# Patient Record
Sex: Male | Born: 1980 | Race: White | Hispanic: No | Marital: Married | Smoking: Never smoker
Health system: Southern US, Community
[De-identification: ages and names within clinical notes are randomized; demographics above are authoritative.]

## PROBLEM LIST (undated history)

## (undated) DIAGNOSIS — I10 Essential (primary) hypertension: Secondary | ICD-10-CM

---

## 2021-03-05 ENCOUNTER — Emergency Department (HOSPITAL_COMMUNITY): Payer: Managed Care, Other (non HMO)

## 2021-03-05 ENCOUNTER — Encounter (HOSPITAL_COMMUNITY): Payer: Self-pay

## 2021-03-05 ENCOUNTER — Inpatient Hospital Stay (HOSPITAL_COMMUNITY): Payer: Managed Care, Other (non HMO)

## 2021-03-05 ENCOUNTER — Other Ambulatory Visit: Payer: Self-pay

## 2021-03-05 ENCOUNTER — Inpatient Hospital Stay (HOSPITAL_COMMUNITY)
Admission: EM | Admit: 2021-03-05 | Discharge: 2021-03-11 | DRG: 065 | Disposition: A | Discharge status: Home / Self Care | Payer: Managed Care, Other (non HMO) | Attending: Neurological Surgery | Admitting: Neurological Surgery

## 2021-03-05 DIAGNOSIS — I618 Other nontraumatic intracerebral hemorrhage: Principal | ICD-10-CM | POA: Diagnosis present

## 2021-03-05 DIAGNOSIS — R480 Dyslexia and alexia: Secondary | ICD-10-CM | POA: Diagnosis present

## 2021-03-05 DIAGNOSIS — G919 Hydrocephalus, unspecified: Secondary | ICD-10-CM | POA: Diagnosis present

## 2021-03-05 DIAGNOSIS — I10 Essential (primary) hypertension: Secondary | ICD-10-CM | POA: Diagnosis present

## 2021-03-05 DIAGNOSIS — G9389 Other specified disorders of brain: Secondary | ICD-10-CM | POA: Diagnosis not present

## 2021-03-05 DIAGNOSIS — Z20822 Contact with and (suspected) exposure to covid-19: Secondary | ICD-10-CM | POA: Diagnosis present

## 2021-03-05 DIAGNOSIS — J302 Other seasonal allergic rhinitis: Secondary | ICD-10-CM | POA: Diagnosis present

## 2021-03-05 DIAGNOSIS — M436 Torticollis: Secondary | ICD-10-CM | POA: Diagnosis present

## 2021-03-05 DIAGNOSIS — E785 Hyperlipidemia, unspecified: Secondary | ICD-10-CM | POA: Diagnosis present

## 2021-03-05 DIAGNOSIS — I6389 Other cerebral infarction: Secondary | ICD-10-CM | POA: Diagnosis not present

## 2021-03-05 DIAGNOSIS — G43109 Migraine with aura, not intractable, without status migrainosus: Secondary | ICD-10-CM | POA: Diagnosis present

## 2021-03-05 DIAGNOSIS — I609 Nontraumatic subarachnoid hemorrhage, unspecified: Secondary | ICD-10-CM

## 2021-03-05 DIAGNOSIS — Z6841 Body Mass Index (BMI) 40.0 and over, adult: Secondary | ICD-10-CM | POA: Diagnosis not present

## 2021-03-05 DIAGNOSIS — I608 Other nontraumatic subarachnoid hemorrhage: Secondary | ICD-10-CM

## 2021-03-05 DIAGNOSIS — I615 Nontraumatic intracerebral hemorrhage, intraventricular: Secondary | ICD-10-CM | POA: Diagnosis present

## 2021-03-05 DIAGNOSIS — H538 Other visual disturbances: Secondary | ICD-10-CM | POA: Diagnosis present

## 2021-03-05 HISTORY — DX: Essential (primary) hypertension: I10

## 2021-03-05 LAB — CBC WITH DIFFERENTIAL/PLATELET
Abs Immature Granulocytes: 0.08 10*3/uL — ABNORMAL HIGH (ref 0.00–0.07)
Basophils Absolute: 0.1 10*3/uL (ref 0.0–0.1)
Basophils Relative: 1 %
Eosinophils Absolute: 0.1 10*3/uL (ref 0.0–0.5)
Eosinophils Relative: 1 %
HCT: 41 % (ref 39.0–52.0)
Hemoglobin: 13.6 g/dL (ref 13.0–17.0)
Immature Granulocytes: 1 %
Lymphocytes Relative: 21 %
Lymphs Abs: 1.9 10*3/uL (ref 0.7–4.0)
MCH: 30.7 pg (ref 26.0–34.0)
MCHC: 33.2 g/dL (ref 30.0–36.0)
MCV: 92.6 fL (ref 80.0–100.0)
Monocytes Absolute: 0.5 10*3/uL (ref 0.1–1.0)
Monocytes Relative: 6 %
Neutro Abs: 6.6 10*3/uL (ref 1.7–7.7)
Neutrophils Relative %: 70 %
Platelets: 299 10*3/uL (ref 150–400)
RBC: 4.43 MIL/uL (ref 4.22–5.81)
RDW: 12.2 % (ref 11.5–15.5)
WBC: 9.2 10*3/uL (ref 4.0–10.5)
nRBC: 0 % (ref 0.0–0.2)

## 2021-03-05 LAB — ECHOCARDIOGRAM COMPLETE
AR max vel: 3.21 cm2
AV Area VTI: 3.3 cm2
AV Area mean vel: 3.2 cm2
AV Mean grad: 6 mmHg
AV Peak grad: 10.5 mmHg
Ao pk vel: 1.62 m/s
Area-P 1/2: 4.39 cm2
Height: 72 in
S' Lateral: 3.8 cm
Weight: 5153.47 oz

## 2021-03-05 LAB — I-STAT CHEM 8, ED
BUN: 11 mg/dL (ref 6–20)
Calcium, Ion: 1.07 mmol/L — ABNORMAL LOW (ref 1.15–1.40)
Chloride: 103 mmol/L (ref 98–111)
Creatinine, Ser: 1 mg/dL (ref 0.61–1.24)
Glucose, Bld: 120 mg/dL — ABNORMAL HIGH (ref 70–99)
HCT: 48 % (ref 39.0–52.0)
Hemoglobin: 16.3 g/dL (ref 13.0–17.0)
Potassium: 3.5 mmol/L (ref 3.5–5.1)
Sodium: 138 mmol/L (ref 135–145)
TCO2: 24 mmol/L (ref 22–32)

## 2021-03-05 LAB — HEMOGLOBIN A1C
Hgb A1c MFr Bld: 5.7 % — ABNORMAL HIGH (ref 4.8–5.6)
Mean Plasma Glucose: 116.89 mg/dL

## 2021-03-05 LAB — URINALYSIS, ROUTINE W REFLEX MICROSCOPIC
Bilirubin Urine: NEGATIVE
Glucose, UA: NEGATIVE mg/dL
Hgb urine dipstick: NEGATIVE
Ketones, ur: NEGATIVE mg/dL
Leukocytes,Ua: NEGATIVE
Nitrite: NEGATIVE
Protein, ur: NEGATIVE mg/dL
Specific Gravity, Urine: 1.006 (ref 1.005–1.030)
pH: 7 (ref 5.0–8.0)

## 2021-03-05 LAB — GLUCOSE, CAPILLARY
Glucose-Capillary: 131 mg/dL — ABNORMAL HIGH (ref 70–99)
Glucose-Capillary: 113 mg/dL — ABNORMAL HIGH (ref 70–99)
Glucose-Capillary: 112 mg/dL — ABNORMAL HIGH (ref 70–99)
Glucose-Capillary: 114 mg/dL — ABNORMAL HIGH (ref 70–99)
Glucose-Capillary: 104 mg/dL — ABNORMAL HIGH (ref 70–99)

## 2021-03-05 LAB — APTT: aPTT: 26 seconds (ref 24–36)

## 2021-03-05 LAB — COMPREHENSIVE METABOLIC PANEL
ALT: 36 U/L (ref 0–44)
AST: 28 U/L (ref 15–41)
Albumin: 4 g/dL (ref 3.5–5.0)
Alkaline Phosphatase: 64 U/L (ref 38–126)
Anion gap: 10 (ref 5–15)
BUN: 16 mg/dL (ref 6–20)
CO2: 25 mmol/L (ref 22–32)
Calcium: 9 mg/dL (ref 8.9–10.3)
Chloride: 100 mmol/L (ref 98–111)
Creatinine, Ser: 1.14 mg/dL (ref 0.61–1.24)
GFR, Estimated: >60 mL/min (ref >60)
Glucose, Bld: 162 mg/dL — ABNORMAL HIGH (ref 70–99)
Potassium: 3.9 mmol/L (ref 3.5–5.1)
Sodium: 135 mmol/L (ref 135–145)
Total Bilirubin: 0.8 mg/dL (ref 0.3–1.2)
Total Protein: 7.1 g/dL (ref 6.5–8.1)

## 2021-03-05 LAB — HIV ANTIBODY (ROUTINE TESTING W REFLEX): HIV Screen 4th Generation wRfx: NONREACTIVE

## 2021-03-05 LAB — RESP PANEL BY RT-PCR (FLU A&B, COVID) ARPGX2
Influenza A by PCR: NEGATIVE
Influenza B by PCR: NEGATIVE
SARS Coronavirus 2 by RT PCR: NEGATIVE

## 2021-03-05 LAB — PROTIME-INR
INR: 1 (ref 0.8–1.2)
Prothrombin Time: 13 seconds (ref 11.4–15.2)

## 2021-03-05 LAB — LIPID PANEL
Cholesterol: 146 mg/dL (ref 0–200)
HDL: 47 mg/dL (ref >40)
LDL Cholesterol: 91 mg/dL (ref 0–99)
Total CHOL/HDL Ratio: 3.1 RATIO
Triglycerides: 42 mg/dL (ref <150)
VLDL: 8 mg/dL (ref 0–40)

## 2021-03-05 LAB — RAPID URINE DRUG SCREEN, HOSP PERFORMED
Amphetamines: NOT DETECTED
Barbiturates: NOT DETECTED
Benzodiazepines: NOT DETECTED
Cocaine: NOT DETECTED
Opiates: NOT DETECTED
Tetrahydrocannabinol: NOT DETECTED

## 2021-03-05 LAB — MRSA PCR SCREENING: MRSA by PCR: NEGATIVE

## 2021-03-05 LAB — TSH: TSH: 2.009 u[IU]/mL (ref 0.350–4.500)

## 2021-03-05 LAB — ETHANOL: Alcohol, Ethyl (B): <10 mg/dL (ref <10)

## 2021-03-05 MED ORDER — ACETAMINOPHEN 650 MG RE SUPP: 650.0000 mg | RECTAL | Status: DC | PRN

## 2021-03-05 MED ORDER — STROKE: EARLY STAGES OF RECOVERY BOOK: Freq: Once | Status: DC

## 2021-03-05 MED ORDER — LABETALOL HCL 5 MG/ML IV SOLN
5.0000 mg | INTRAVENOUS | Status: DC | PRN
  Administered 2021-03-05 – 2021-03-06 (×4): 5 mg via INTRAVENOUS
  Filled 2021-03-05 (×4): qty 4

## 2021-03-05 MED ORDER — OXYCODONE HCL 5 MG PO TABS
10.0000 mg | ORAL_TABLET | ORAL | Status: DC | PRN
  Administered 2021-03-05 – 2021-03-08 (×3): 10 mg via ORAL
  Filled 2021-03-05 (×2): qty 2

## 2021-03-05 MED ORDER — ROSUVASTATIN CALCIUM 5 MG PO TABS
10.0000 mg | ORAL_TABLET | Freq: Every day | ORAL | Status: DC
  Administered 2021-03-05 – 2021-03-10 (×6): 10 mg via ORAL
  Filled 2021-03-05 (×6): qty 2

## 2021-03-05 MED ORDER — STROKE: EARLY STAGES OF RECOVERY BOOK
Freq: Once | Status: AC
  Filled 2021-03-05: qty 1

## 2021-03-05 MED ORDER — GADOBUTROL 1 MMOL/ML IV SOLN
10.0000 mL | Freq: Once | INTRAVENOUS | Status: AC | PRN
  Administered 2021-03-05: 10 mL via INTRAVENOUS

## 2021-03-05 MED ORDER — ACETAMINOPHEN 160 MG/5ML PO SOLN: 650.0000 mg | ORAL | Status: DC | PRN

## 2021-03-05 MED ORDER — PANTOPRAZOLE SODIUM 40 MG IV SOLR
40.0000 mg | Freq: Every day | INTRAVENOUS | Status: DC
  Administered 2021-03-05 – 2021-03-10 (×6): 40 mg via INTRAVENOUS
  Filled 2021-03-05 (×6): qty 40

## 2021-03-05 MED ORDER — FLUTICASONE PROPIONATE 50 MCG/ACT NA SUSP
1.0000 | Freq: Every day | NASAL | Status: DC
  Administered 2021-03-06 – 2021-03-11 (×4): 1 via NASAL
  Filled 2021-03-05: qty 16

## 2021-03-05 MED ORDER — NIMODIPINE 30 MG PO CAPS
60.0000 mg | ORAL_CAPSULE | ORAL | Status: DC
  Administered 2021-03-05 – 2021-03-08 (×19): 60 mg via ORAL
  Filled 2021-03-05 (×19): qty 2

## 2021-03-05 MED ORDER — IRBESARTAN 150 MG PO TABS
150.0000 mg | ORAL_TABLET | Freq: Every day | ORAL | Status: DC
  Administered 2021-03-05 – 2021-03-11 (×6): 150 mg via ORAL
  Filled 2021-03-05 (×6): qty 1

## 2021-03-05 MED ORDER — ONDANSETRON HCL 4 MG/2ML IJ SOLN
4.0000 mg | Freq: Four times a day (QID) | INTRAMUSCULAR | Status: DC | PRN
  Administered 2021-03-05 – 2021-03-11 (×5): 4 mg via INTRAVENOUS
  Filled 2021-03-05 (×5): qty 2

## 2021-03-05 MED ORDER — OXYCODONE HCL 5 MG PO TABS
5.0000 mg | ORAL_TABLET | ORAL | Status: DC | PRN
  Administered 2021-03-09 – 2021-03-11 (×5): 5 mg via ORAL
  Filled 2021-03-05 (×7): qty 1

## 2021-03-05 MED ORDER — CHLORHEXIDINE GLUCONATE 0.12 % MT SOLN
15.0000 mL | Freq: Two times a day (BID) | OROMUCOSAL | Status: DC
  Administered 2021-03-05 (×2): 15 mL via OROMUCOSAL
  Filled 2021-03-05: qty 15

## 2021-03-05 MED ORDER — IOHEXOL 350 MG/ML SOLN
75.0000 mL | Freq: Once | INTRAVENOUS | Status: AC | PRN
  Administered 2021-03-05: 75 mL via INTRAVENOUS

## 2021-03-05 MED ORDER — INSULIN ASPART 100 UNIT/ML IJ SOLN
0.0000 [IU] | INTRAMUSCULAR | Status: DC
  Administered 2021-03-05 – 2021-03-06 (×2): 1 [IU] via SUBCUTANEOUS

## 2021-03-05 MED ORDER — LORATADINE 10 MG PO TABS
10.0000 mg | ORAL_TABLET | Freq: Every day | ORAL | Status: DC
  Administered 2021-03-05 – 2021-03-11 (×5): 10 mg via ORAL
  Filled 2021-03-05 (×5): qty 1

## 2021-03-05 MED ORDER — SODIUM CHLORIDE 0.9 % IV SOLN: INTRAVENOUS | Status: DC

## 2021-03-05 MED ORDER — ACETAMINOPHEN 325 MG PO TABS
650.0000 mg | ORAL_TABLET | ORAL | Status: DC | PRN
  Administered 2021-03-05 – 2021-03-11 (×17): 650 mg via ORAL
  Filled 2021-03-05 (×20): qty 2

## 2021-03-05 MED ORDER — CHLORHEXIDINE GLUCONATE CLOTH 2 % EX PADS
6.0000 | MEDICATED_PAD | Freq: Every day | CUTANEOUS | Status: DC
  Administered 2021-03-05 – 2021-03-09 (×4): 6 via TOPICAL

## 2021-03-05 MED ORDER — SENNOSIDES-DOCUSATE SODIUM 8.6-50 MG PO TABS
1.0000 | ORAL_TABLET | Freq: Two times a day (BID) | ORAL | Status: DC
  Administered 2021-03-05 – 2021-03-11 (×9): 1 via ORAL
  Filled 2021-03-05 (×9): qty 1

## 2021-03-05 NOTE — ED Provider Notes (Addendum)
  Emergency Medicine Provider in Triage Note   MSE was initiated and I personally evaluated the patient  12:51 AM on Mar 05, 2021 as provider in triage.   Chief Complaint: headache  HPI  Patient is a 40 y.o. who presents to the ED with complaints of with complaints of headache that began 30 minutes PTA. Fairly quick onset, frontal, with associated blurry vision bilaterally- difficulty reading the information on his phone, vision improving. Took 2 ibuprofen, improved some,current pain 4/10 in severity. No hx of same headache.    Review of Systems  Positive: Headache, visual disturbance Negative: Numbness, weakness, slurred speech, dizziness, loss of vision  Physical Exam  BP (!) 141/89 (BP Location: Right Arm)   Pulse 85   Temp 99.3 F (37.4 C) (Oral)   Resp 17   Ht 6' (1.829 m)   Wt (!) 142.9 kg   SpO2 97%   BMI 42.72 kg/m    Gen:   Awake, no distress   HEENT:  Atraumatic  Resp:  Normal effort  Cardiac:  Normal rate  Abd:   Nondistended, nontender  MSK:   Moves extremities without difficulty  Neuro:  Speech clear. CN III-XII grossly intact- able to read all font sizes on my badge (limited visual acuity assessment in triage),  PERRL. EOMI. Sensation grossly intact x 4. 5/5 strength with grip strength & plantar/dorsiflexion. Alert & oriented x 4. Intact finger to nose.   Medical Decision Making   Initiation of care has begun. The patient has been counseled on the process, plan, and necessity for staying for the completion/evaluation, informed that the remainder of the evaluation will be completed by another provider, this initial triage assessment does not replace that evaluation, and the importance of remaining in the ED until their evaluation is complete.  Clinical Impression  Headache.         Desmond Lope 03/05/21 8768  Patient started having emesis in the lobby- CT requested to come for patient as soon as possible once this began. Subsequently  called radiology @ 2:30AM to ensure prompt CT imaging read. --> please see full H&P for further care details.    Cherly Anderson, PA-C 03/05/21 0417    Sabas Sous, MD 03/05/21 [phone]

## 2021-03-05 NOTE — Progress Notes (Incomplete)
  Echocardiogram 2D Echocardiogram has been performed.  Avon Mergenthaler  Marthann Schiller 03/05/2021, 1:53 PM

## 2021-03-05 NOTE — ED Triage Notes (Signed)
Patient reports sudden onset headache, stiff neck, reports some vision loss, took his BP and it was 160/100, took 2 advil with some relief

## 2021-03-05 NOTE — ED Notes (Signed)
Report called to RN receiving pt in room 4N20.

## 2021-03-05 NOTE — Progress Notes (Signed)
[facility] TEAM PROGRESS NOTE   INTERVAL HISTORY No acute events since arrival.  He recalls that yesterday's presenting symptom was tremendous headache and that he could see things but did not understand what he was seeing. He now reports resolution of these symptoms. He has been texting this morning without difficulty.   We discussed his stroke diagnosis, plan of care and ongoing work up. 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 They are interested in the science and detailed technical explanations. Images were reviewed at bedside.  All questions were addressed.   Vitals:   03/05/21 0600 03/05/21 0630 03/05/21 0700 03/05/21 0800  BP: 120/69 130/71 126/68 113/60  Pulse: 99 96 89 94  Resp: 16 18 17 16   Temp:    98.8 F (37.1 C)  TempSrc:    Oral  SpO2: 97% 95% 94% 95%  Weight:      Height:       CBC:  Recent Labs  Lab 03/05/21 0237 03/05/21 0255  WBC 9.2  --   NEUTROABS 6.6  --   HGB 13.6 16.3  HCT 41.0 48.0  MCV 92.6  --   PLT 299  --    Basic Metabolic Panel:  Recent Labs  Lab 03/05/21 0237 03/05/21 0255  NA 135 138  K 3.9 3.5  CL 100 103  CO2 25  --   GLUCOSE 162* 120*  BUN 16 11  CREATININE 1.14 1.00  CALCIUM 9.0  --    Lipid Panel:  Recent Labs  Lab 03/05/21 0442  CHOL 146  TRIG 42  HDL 47  CHOLHDL 3.1  VLDL 8  LDLCALC 91   HgbA1c:  Recent Labs  Lab 03/05/21 0442  HGBA1C 5.7*   Urine Drug Screen: No results for input(s): LABOPIA, COCAINSCRNUR, LABBENZ, AMPHETMU, THCU, LABBARB in the last 168 hours.  Alcohol Level  Recent Labs  Lab 03/05/21 0442  ETH <10    IMAGING past 24 hours CT Angio Head W or Wo Contrast  Result Date: 03/05/2021 CLINICAL DATA:  Intraventricular hemorrhage EXAM: CT ANGIOGRAPHY HEAD AND NECK TECHNIQUE: Multidetector CT imaging of the head and neck was performed using the standard protocol during bolus administration of intravenous contrast.  Multiplanar CT image reconstructions and MIPs were obtained to evaluate the vascular anatomy. Carotid stenosis measurements (when applicable) are obtained utilizing NASCET criteria, using the distal internal carotid diameter as the denominator. CONTRAST:  24mL OMNIPAQUE IOHEXOL 350 MG/ML SOLN COMPARISON:  None. FINDINGS: CTA NECK FINDINGS SKELETON: There is no bony spinal canal stenosis. No lytic or blastic lesion. OTHER NECK: Normal pharynx, larynx and major salivary glands. No cervical lymphadenopathy. Unremarkable thyroid gland. UPPER CHEST: No pneumothorax or pleural effusion. No nodules or masses. AORTIC ARCH: There is no calcific atherosclerosis of the aortic arch. There is no aneurysm, dissection or hemodynamically significant stenosis of the visualized portion of the aorta. Conventional 3 vessel aortic branching pattern. The visualized proximal subclavian arteries are widely patent. RIGHT CAROTID SYSTEM: Normal without aneurysm, dissection or stenosis. LEFT CAROTID SYSTEM: Normal without aneurysm, dissection or stenosis. VERTEBRAL ARTERIES: Left dominant configuration. Both origins are clearly patent. There is no dissection, occlusion or flow-limiting stenosis to the skull base (V1-V3 segments). CTA HEAD FINDINGS Enhancing, vascular lesion at the splenium of the corpus callosum, measuring 2.8 x 1.1 cm. Dilated draining vein is continuous with the vein of Galen. Arterial supply appears mostly from the distal anterior cerebral artery. Unchanged appearance of intraventricular hemorrhage.  POSTERIOR CIRCULATION: --Vertebral arteries: Normal V4 segments. --Inferior cerebellar arteries: Normal. --Basilar artery: Normal. --Superior cerebellar arteries: Normal. --Posterior cerebral arteries (PCA): Normal. ANTERIOR CIRCULATION: --Intracranial internal carotid arteries: Normal. --Anterior cerebral arteries (ACA): Distal anterior cerebral artery connects to the vascular nidus at the corpus callosum splenium. --Middle  cerebral arteries (MCA): Normal. VENOUS SINUSES: Dilated draining vein extending from above described vascular lesion to the vein of Galen/anterior straight sinus. ANATOMIC VARIANTS: None Review of the MIP images confirms the above findings. IMPRESSION: 1. 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 Arterial supply from the distal anterior cerebral artery. 2. No vascular occlusion or high-grade stenosis. 3. Unchanged appearance of intraventricular hemorrhage. Electronically Signed   By: Deatra Robinson M.D.   On: 03/05/2021 03:35   CT Head Wo Contrast  Result Date: 03/05/2021 CLINICAL DATA:  Headache and hypertension. EXAM: CT HEAD WITHOUT CONTRAST TECHNIQUE: Contiguous axial images were obtained from the base of the skull through the vertex without intravenous contrast. COMPARISON:  None. FINDINGS: Brain: Moderate volume acute intraventricular hemorrhage with blood in the left greater than right lateral ventricles, third ventricle, and fourth ventricles. Slight prominence of the ventricular system. No sulcal effacement. No hemorrhage in the circle-of-Willis. No subdural collection. No evidence of acute ischemia or infarct. No midline shift or mass effect. Basilar cisterns are patent. Vascular: No hyperdense vessel. Skull: No fracture or focal lesion. Sinuses/Orbits: Trace mucosal thickening of right maxillary sinus. Paranasal sinuses otherwise clear. Mastoid air cells are clear. Unremarkable orbits. Other: None. IMPRESSION: Moderate volume acute intraventricular hemorrhage. Slight prominence of the ventricular system. Critical Value/emergent results were called by telephone at the time of interpretation on 03/05/2021 at 2:34 am to provider [facility] , who verbally acknowledged these results. Electronically Signed   By: Narda Rutherford M.D.   On: 03/05/2021 02:35   CT Angio Neck W and/or Wo Contrast  Result Date:  03/05/2021 : CLINICAL DATA:   Intraventricular hemorrhage EXAM: CT ANGIOGRAPHY HEAD AND NECK TECHNIQUE: Multidetector CT imaging of the head and neck was performed using the standard protocol during bolus administration of intravenous contrast. Multiplanar CT image reconstructions and MIPs were obtained to evaluate the vascular anatomy. Carotid stenosis measurements (when applicable) are obtained utilizing NASCET criteria, using the distal internal carotid diameter as the denominator. CONTRAST:   71mL OMNIPAQUE IOHEXOL 350 MG/ML SOLN COMPARISON:   None. FINDINGS: CTA NECK FINDINGS SKELETON: There is no bony spinal canal stenosis. No lytic or blastic lesion. OTHER NECK: Normal pharynx, larynx and major salivary glands. No cervical lymphadenopathy. Unremarkable thyroid gland. UPPER CHEST: No pneumothorax or pleural effusion. No nodules or masses. AORTIC ARCH: There is no calcific atherosclerosis of the aortic arch. There is no aneurysm, dissection or hemodynamically significant stenosis of the visualized portion of the aorta. Conventional 3 vessel aortic branching pattern. The visualized proximal subclavian arteries are widely patent. RIGHT CAROTID SYSTEM: Normal without aneurysm, dissection or stenosis. LEFT CAROTID SYSTEM: Normal without aneurysm, dissection or stenosis. VERTEBRAL ARTERIES: Left dominant configuration. Both origins are clearly patent. There is no dissection, occlusion or flow-limiting stenosis to the skull base (V1-V3 segments). CTA HEAD FINDINGS Enhancing, vascular lesion at the splenium of the corpus callosum, measuring 2.8 x 1.1 cm. Dilated draining vein is continuous with the vein of Galen. Arterial supply appears mostly from the distal anterior cerebral artery. Unchanged appearance of intraventricular hemorrhage. POSTERIOR CIRCULATION: --Vertebral arteries: Normal V4 segments. --Inferior cerebellar arteries: Normal. --Basilar artery: Normal. --Superior cerebellar  arteries: Normal. --Posterior  cerebral arteries (PCA): Normal. ANTERIOR CIRCULATION: --Intracranial internal carotid arteries: Normal. --Anterior cerebral arteries (ACA): Distal anterior cerebral artery connects to the vascular nidus at the corpus callosum splenium. --Middle cerebral arteries (MCA): Normal. VENOUS SINUSES: Dilated draining vein extending from above described vascular lesion to the vein of Galen/anterior straight sinus. ANATOMIC VARIANTS: None Review of the MIP images confirms the above findings. IMPRESSION: 1. 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 Arterial supply from the distal anterior cerebral artery. 2. No vascular occlusion or high-grade stenosis. 3. Unchanged appearance of intraventricular hemorrhage. Electronically Signed   By: Deatra Robinson M.D.   On: 03/05/2021 03:40   PHYSICAL EXAM Constitutional: Appears well-developed and well-nourished.  Psych: Affect appropriate to situation, calm and cooperative Eyes: No scleral injection HENT: No oropharyngeal obstruction.  MSK: no joint deformities.  Cardiovascular: Normal rate and regular rhythm.  Respiratory: Effort normal, non-labored breathing GI: Soft.  No distension. There is no tenderness.  Skin: Warm dry and intact visible skin  Neuro: Mental Status: Patient is awake, alert, oriented to person, place, month, year, and situation. Patient is able to give a clear and coherent history. No signs of aphasia or neglect.  No trouble with reading or writing. Cranial Nerves: II: Visual Fields are full. Pupils are equal, round, and reactive to light 4 to 2 mm.   III,IV, VI: EOMI without ptosis or diploplia.  V: Facial sensation is symmetric to temperature VII: Facial movement is symmetric.  VIII: hearing is intact to voice X: Uvula elevates symmetrically XI: Shoulder shrug is symmetric. XII: tongue is midline without atrophy or fasciculations.  Motor: Tone is normal.  Bulk is normal. 5/5 strength was present in all four extremities.  Sensory: Sensation is symmetric to light touch and temperature in the arms and legs. Cerebellar: FNF and HKS are intact bilaterally  ASSESSMENT/PLAN Harold Bright is a 40 y.o. male with a past medical history significant for hypertension, ocular migraines, hyperlipidemia, seasonal allergies, not on anticoagulant or antiplatelet medications presenting with sudden onset worst headache of his life and visual disturbance consistent with alexia without agraphia.   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 Slight prominence of theventricles unchanged from CT earlier today.   2D Echo pending  LDL 91  HgbA1c 5.7  VTE prophylaxis - per primary team     Diet   Diet Carb Modified Fluid consistency: Thin; Room service appropriate? 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  Neurosurgery managing and formulating plan of care   On Nimotop   Hypertension  Stable . BP goal less than 160 . Long-term BP goal normotensive  Hyperlipidemia  Home meds: resumed in  hospital  LDL 91, goal < 70  High intensity statin:    Continue statin at discharge  Other Stroke Risk Factors  Current ETOH use, alcohol level <10, advised to drink no more than 2 drink(s) a day   Obesity, Body mass index is 43.68 kg/m., BMI >/= 30 associated with increased stroke risk, recommend weight loss, diet and exercise as appropriate   Hx of ocular migraines   High risk for Obstructive sleep apnea  Other Active Problems    Hospital day # 0 I have personally obtained history,examined this patient, reviewed notes, independently viewed imaging studies, participated in medical decision making and plan of care.ROS completed by me personally and pertinent positives fully documented  I have made any additions or clarifications directly to the above note. Agree with note above.  Patient is diagnostic cerebral catheter angiogram to the anatomy and features of the AVM normal to plan definitive treatment as to surgery versus endovascular treatment this will be done electively early next week.  Maintain strict control of hypertension with blood pressure goal below 140/90.  Close neurological monitoring.  Long discussion with the patient and wife and mother at the bedside and answered questions.  Discussed with Dr. Johnsie Cancelstergaard.This patient is critically ill and at significant risk of neurological worsening, death and care requires constant monitoring of vital signs, hemodynamics,respiratory and cardiac monitoring, extensive review of multiple databases, frequent neurological assessment, discussion with family, other specialists and medical decision making of high complexity.I have made any additions or clarifications directly to the above note.This critical care time does not reflect procedure time, or teaching time or supervisory time of PA/NP/Med Resident etc but could involve care discussion time.  I spent 30 minutes of neurocritical care time  in the care of  this patient.     Delia HeadyPramod Tycho Cheramie, MD Medical Director [facility] Pager:  [phone] 03/06/2021 3:21 PM    To contact Stroke Continuity provider, please refer to [email or web address]. After hours, contact General Neurology

## 2021-03-05 NOTE — H&P (Signed)
Neurosurgery H&P  CC: Headache  HPI: This is a 40 y.o. man that presents with acute onset WHOL. He also had some difficulty with reading - could see the characters but they didn't make any sense, prompting him to come to the hospital. He is right handed, the headache is almost resolved now, he does endorse neck stiffness, the dyslexia he was experiencing has resolved. No h/o prior ICH/IVH, no fam h/o intracranial aneurysm. The headache is currently mild in intensity, located diffusely / globally and extending into the neck, worse with movement, improved with rest. No recent use of anti-platelet or anti-coagulant medications.   ROS: A 14 point ROS was performed and is negative except as noted in the HPI.   PMHx:  Past Medical History:  Diagnosis Date  . Hypertension    FamHx: History reviewed. No pertinent family history. SocHx:  does not have a smoking history on file. He has never used smokeless tobacco. He reports current alcohol use. He reports that he does not use drugs.  Exam: Vital signs in last 24 hours: Temp:  [99 F (37.2 C)-99.3 F (37.4 C)] 99 F (37.2 C) (05/21 0400) Pulse Rate:  [82-99] 94 (05/21 0800) Resp:  [16-27] 16 (05/21 0800) BP: (113-141)/(56-89) 113/60 (05/21 0800) SpO2:  [94 %-100 %] 95 % (05/21 0800) Weight:  [142.9 kg-146.1 kg] 146.1 kg (05/21 0400) General: Awake, alert, cooperative, lying in bed in NAD Head: Normocephalic and atruamatic HEENT: +meningismus Pulmonary: breathing room air comfortably, no evidence of increased work of breathing Cardiac: tachycardic, regular  Abdomen: obese S NT ND Extremities: Warm and well perfused x4 Neuro: AOx3, PERRL, EOMI, FS, speech fluent with normal content, VFF to confrontation, reading comprehension intact Strength 5/5 x4, SILTx4, no drift   Assessment and Plan: 40 y.o. man w/ WHOL. CTH/CTA personally reviewed, which show pan-IVH with mild, if any, ventricular dilation. CTA shows left splenial vascular  irregularity, likely AVM measuring at 3cm, compact nidus, large dilated draining vein that appears to wrap around the nidus and drain into Galen. Unable to say definitively given the type of study but appears to be fed by ACA. Pt is Hunt-Hess 2, WFNS grade 1, modified Fisher 2.  -MRI to better delineate anatomy and location -angiogram 5/23 to confirm the diagnosis. Will d/w Dr. Conchita Paris - atypical location. Likely will pursue endovascular treatment of any high risk features and then SRS. But measurements and more detailed study may show that this could be safely resected. -keep in neuro ICU, SBP<160 -will start nimodipine -SCDs/TEDs, SQH  Jadene Pierini, MD 03/05/21 8:42 AM [facility]

## 2021-03-05 NOTE — ED Provider Notes (Signed)
[facility]   CSN: [id number] Arrival date & time: 03/05/21  0016     History Chief Complaint  Patient presents with  . Headache  . Hypertension    Carlus PavlovSteven Michael Riedesel is a 40 y.o. male with a hx of hypertension who presents to the ED with complains of headache that began 30 minutes prior to arrival. Patient states he was at rest when he had fairly quick onset headache to the frontal region with associated blurry vision bilaterally (no complete loss of vision), was having difficulty reading the information on his phone, overall vision improving. He took 2 ibuprofen with some improvement, headache 4/10 in severity on arrival, however after evaluation in triage started to worsen again with emesis. He denies vision loss, numbness, weakness, dizziness, slurred speech, chest pain, or dyspnea. He takes losartan for BP control.   HPI     Past Medical History:  Diagnosis Date  . Hypertension     There are no problems to display for this patient.   History reviewed. No pertinent surgical history.     No family history on file.  Social History   Tobacco Use  . Smokeless tobacco: Never Used  Substance Use Topics  . Alcohol use: Yes    Comment: 2 a day  . Drug use: Never    Home Medications Prior to Admission medications   Not on File    Allergies    Penicillins and Sulfa antibiotics  Review of Systems   Review of Systems  Constitutional: Negative for chills and fever.  Eyes: Positive for visual disturbance.  Respiratory: Negative for shortness of breath.   Cardiovascular: Negative for chest pain.  Gastrointestinal: Positive for vomiting. Negative for abdominal pain.  Neurological: Positive for headaches. Negative for dizziness, syncope, weakness and numbness.  All other systems reviewed and are negative.   Physical Exam Updated Vital Signs BP (!) 141/89 (BP Location: Right Arm)   Pulse 85   Temp 99.3 F  (37.4 C) (Oral)   Resp 17   Ht 6' (1.829 m)   Wt (!) 142.9 kg   SpO2 97%   BMI 42.72 kg/m   Physical Exam Vitals and nursing note reviewed.  Constitutional:      General: He is not in acute distress.    Appearance: He is well-developed. He is not toxic-appearing.  HENT:     Head: Normocephalic and atraumatic.  Eyes:     General: Vision grossly intact.        Right eye: No discharge.        Left eye: No discharge.     Extraocular Movements: Extraocular movements intact.     Conjunctiva/sclera: Conjunctivae normal.     Comments: PERRL.   Cardiovascular:     Rate and Rhythm: Normal rate and regular rhythm.  Pulmonary:     Effort: Pulmonary effort is normal. No respiratory distress.     Breath sounds: Normal breath sounds. No wheezing, rhonchi or rales.  Abdominal:     General: There is no distension.     Palpations: Abdomen is soft.     Tenderness: There is no abdominal tenderness.  Musculoskeletal:     Cervical back: Neck supple.  Skin:    General: Skin is warm and dry.     Findings: No rash.  Neurological:     Mental Status: He is alert.     Comments: Alert. Clear speech. No facial droop. CNIII-XII grossly intact. Bilateral upper and lower extremities'  sensation grossly intact. 5/5 symmetric strength with grip strength and with plantar and dorsi flexion bilaterally.  Normal finger to nose bilaterally. Negative pronator drift.   Psychiatric:        Behavior: Behavior normal.     ED Results / Procedures / Treatments   Labs (all labs ordered are listed, but only abnormal results are displayed) Labs Reviewed  RESP PANEL BY RT-PCR (FLU A&B, COVID) ARPGX2  COMPREHENSIVE METABOLIC PANEL  CBC WITH DIFFERENTIAL/PLATELET  PROTIME-INR  APTT  ETHANOL  I-STAT CHEM 8, ED    EKG None  Radiology CT Angio Head W or Wo Contrast  Result Date: 03/05/2021 CLINICAL DATA:  Intraventricular hemorrhage EXAM: CT ANGIOGRAPHY HEAD AND NECK TECHNIQUE: Multidetector CT imaging of the  head and neck was performed using the standard protocol during bolus administration of intravenous contrast. Multiplanar CT image reconstructions and MIPs were obtained to evaluate the vascular anatomy. Carotid stenosis measurements (when applicable) are obtained utilizing NASCET criteria, using the distal internal carotid diameter as the denominator. CONTRAST:  60mL OMNIPAQUE IOHEXOL 350 MG/ML SOLN COMPARISON:  None. FINDINGS: CTA NECK FINDINGS SKELETON: There is no bony spinal canal stenosis. No lytic or blastic lesion. OTHER NECK: Normal pharynx, larynx and major salivary glands. No cervical lymphadenopathy. Unremarkable thyroid gland. UPPER CHEST: No pneumothorax or pleural effusion. No nodules or masses. AORTIC ARCH: There is no calcific atherosclerosis of the aortic arch. There is no aneurysm, dissection or hemodynamically significant stenosis of the visualized portion of the aorta. Conventional 3 vessel aortic branching pattern. The visualized proximal subclavian arteries are widely patent. RIGHT CAROTID SYSTEM: Normal without aneurysm, dissection or stenosis. LEFT CAROTID SYSTEM: Normal without aneurysm, dissection or stenosis. VERTEBRAL ARTERIES: Left dominant configuration. Both origins are clearly patent. There is no dissection, occlusion or flow-limiting stenosis to the skull base (V1-V3 segments). CTA HEAD FINDINGS Enhancing, vascular lesion at the splenium of the corpus callosum, measuring 2.8 x 1.1 cm. Dilated draining vein is continuous with the vein of Galen. Arterial supply appears mostly from the distal anterior cerebral artery. Unchanged appearance of intraventricular hemorrhage. POSTERIOR CIRCULATION: --Vertebral arteries: Normal V4 segments. --Inferior cerebellar arteries: Normal. --Basilar artery: Normal. --Superior cerebellar arteries: Normal. --Posterior cerebral arteries (PCA): Normal. ANTERIOR CIRCULATION: --Intracranial internal carotid arteries: Normal. --Anterior cerebral arteries  (ACA): Distal anterior cerebral artery connects to the vascular nidus at the corpus callosum splenium. --Middle cerebral arteries (MCA): Normal. VENOUS SINUSES: Dilated draining vein extending from above described vascular lesion to the vein of Galen/anterior straight sinus. ANATOMIC VARIANTS: None Review of the MIP images confirms the above findings. IMPRESSION: 1. 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 Arterial supply from the distal anterior cerebral artery. 2. No vascular occlusion or high-grade stenosis. 3. Unchanged appearance of intraventricular hemorrhage. Electronically Signed   By: Deatra Robinson M.D.   On: 03/05/2021 03:35   CT Head Wo Contrast  Result Date: 03/05/2021 CLINICAL DATA:  Headache and hypertension. EXAM: CT HEAD WITHOUT CONTRAST TECHNIQUE: Contiguous axial images were obtained from the base of the skull through the vertex without intravenous contrast. COMPARISON:  None. FINDINGS: Brain: Moderate volume acute intraventricular hemorrhage with blood in the left greater than right lateral ventricles, third ventricle, and fourth ventricles. Slight prominence of the ventricular system. No sulcal effacement. No hemorrhage in the circle-of-Willis. No subdural collection. No evidence of acute ischemia or infarct. No midline shift or mass effect. Basilar cisterns are patent. Vascular: No hyperdense vessel. Skull: No fracture  or focal lesion. Sinuses/Orbits: Trace mucosal thickening of right maxillary sinus. Paranasal sinuses otherwise clear. Mastoid air cells are clear. Unremarkable orbits. Other: None. IMPRESSION: Moderate volume acute intraventricular hemorrhage. Slight prominence of the ventricular system. Critical Value/emergent results were called by telephone at the time of interpretation on 03/05/2021 at 2:34 am to provider [facility] , who verbally acknowledged these results. Electronically Signed    By: Narda Rutherford M.D.   On: 03/05/2021 02:35   CT Angio Neck W and/or Wo Contrast  Result Date: 03/05/2021 : CLINICAL DATA:   Intraventricular hemorrhage EXAM: CT ANGIOGRAPHY HEAD AND NECK TECHNIQUE: Multidetector CT imaging of the head and neck was performed using the standard protocol during bolus administration of intravenous contrast. Multiplanar CT image reconstructions and MIPs were obtained to evaluate the vascular anatomy. Carotid stenosis measurements (when applicable) are obtained utilizing NASCET criteria, using the distal internal carotid diameter as the denominator. CONTRAST:   57mL OMNIPAQUE IOHEXOL 350 MG/ML SOLN COMPARISON:   None. FINDINGS: CTA NECK FINDINGS SKELETON: There is no bony spinal canal stenosis. No lytic or blastic lesion. OTHER NECK: Normal pharynx, larynx and major salivary glands. No cervical lymphadenopathy. Unremarkable thyroid gland. UPPER CHEST: No pneumothorax or pleural effusion. No nodules or masses. AORTIC ARCH: There is no calcific atherosclerosis of the aortic arch. There is no aneurysm, dissection or hemodynamically significant stenosis of the visualized portion of the aorta. Conventional 3 vessel aortic branching pattern. The visualized proximal subclavian arteries are widely patent. RIGHT CAROTID SYSTEM: Normal without aneurysm, dissection or stenosis. LEFT CAROTID SYSTEM: Normal without aneurysm, dissection or stenosis. VERTEBRAL ARTERIES: Left dominant configuration. Both origins are clearly patent. There is no dissection, occlusion or flow-limiting stenosis to the skull base (V1-V3 segments). CTA HEAD FINDINGS Enhancing, vascular lesion at the splenium of the corpus callosum, measuring 2.8 x 1.1 cm. Dilated draining vein is continuous with the vein of Galen. Arterial supply appears mostly from the distal anterior cerebral artery. Unchanged appearance of intraventricular hemorrhage. POSTERIOR CIRCULATION: --Vertebral arteries: Normal V4 segments. --Inferior  cerebellar arteries: Normal. --Basilar artery: Normal. --Superior cerebellar arteries: Normal. --Posterior cerebral arteries (PCA): Normal. ANTERIOR CIRCULATION: --Intracranial internal carotid arteries: Normal. --Anterior cerebral arteries (ACA): Distal anterior cerebral artery connects to the vascular nidus at the corpus callosum splenium. --Middle cerebral arteries (MCA): Normal. VENOUS SINUSES: Dilated draining vein extending from above described vascular lesion to the vein of Galen/anterior straight sinus. ANATOMIC VARIANTS: None Review of the MIP images confirms the above findings. IMPRESSION: 1. 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 Arterial supply from the distal anterior cerebral artery. 2. No vascular occlusion or high-grade stenosis. 3. Unchanged appearance of intraventricular hemorrhage. Electronically Signed   By: Deatra Robinson M.D.   On: 03/05/2021 03:40    Procedures .Critical Care Performed by: Cherly Anderson, PA-C Authorized by: Cherly Anderson, PA-C     CRITICAL CARE Performed by: Harvie Heck   Total critical care time: 45 minutes  Critical care time was exclusive of separately billable procedures and treating other patients.  Critical care was necessary to treat or prevent imminent or life-threatening deterioration.  Critical care was time spent personally by me on the following activities: development of treatment plan with patient and/or surrogate as well as nursing, discussions with consultants, evaluation of patient's response to treatment, examination of patient, obtaining history from patient or surrogate, ordering and performing treatments and interventions, ordering and review of laboratory studies, ordering and review  of radiographic studies, pulse oximetry and re-evaluation of patient's condition.   Medications Ordered in ED Medications  iohexol  (OMNIPAQUE) 350 MG/ML injection 75 mL (75 mLs Intravenous Contrast Given 03/05/21 0322)    ED Course  I have reviewed the triage vital signs and the nursing notes.  Pertinent labs & imaging results that were available during my care of the patient were reviewed by me and considered in my medical decision making (see chart for details).    MDM Rules/Calculators/A&P                          Patient presents to the ED with complaints of headache, seen by me as provider in triage, head CT ordered on initial evaluation due to quick onset headache. Patient informed during triage process to alert staff of any change in sxs, started to have worsened pain with emesis therefore CT and subsequently neurology were called to facilitate care.   02:34: CONSULT: Discussed with radiologist Dr. Marisue Humble- intraventricular hemorrhage on CT. I have alerted triage/charge nurse that patient needs acute care bed immediately, will monitor in triage while pending bed.   02:40: On re-assessment, BP 128/56, no acute change in neuro exam at this time, patient updated on results & plan of care. Holding off on antihypertensives with reassuring BP at this time.   02:44: CONSULT: Discussed with neurologist Dr. Iver Nestle, will discuss w/ neurosurgery.   02:46: CONSULT: Discussed with Diplomatic Services operational officer with neurosurgery- will page on call neurosurgeon.   02:56: CONSULT:Discussed with neurosurgeon Dr. Maurice Small- recommends CTA, admit to ICU under neurology service.    02:59: CONSULT: Re-discussed with Dr. Iver Nestle- will place admit orders.   Additional history obtained:  Additional history obtained from chart review & nursing note review.   Lab Tests:  I Ordered, reviewed, and interpreted labs, which included:  CBC, CMP: hyperglycemia.  PT/INR/APTT: WNL  Imaging Studies ordered:  I ordered imaging studies which included CT head wo contrast and subsequently CTA, I independently reviewed, formal radiology reports as above.    Patient admitted to neurology service.   Portions of this note were generated with Scientist, clinical (histocompatibility and immunogenetics). Dictation errors may occur despite best attempts at proofreading.  Final Clinical Impression(s) / ED Diagnoses Final diagnoses:  Intraventricular hemorrhage [facility])    Rx / DC Orders ED Discharge Orders    None       Cherly Anderson, PA-C 03/05/21 0405    Geoffery Lyons, MD 03/05/21 [phone]

## 2021-03-05 NOTE — H&P (Addendum)
Neurology H&P Admission Note Reason for Consult: IVH  CC: Headache and blurred vision  History is obtained from: Patient and chart review   HPI: Harold Bright is a 40 y.o. male with a past medical history significant for hypertension, ocular migraines, hyperlipidemia, seasonal allergies, presenting with sudden onset headache and visual disturbance.  He reports he was in his usual state of health today, slightly worried about his 80-year-old daughter who had a fever to 104 (strep/COVID/flu negative).  He was watching TV when at midnight he had sudden onset vision change and headache as well as neck stiffness.  While in the ED triage area that he had an episode of emesis and was taken emergently back for head CT which revealed intraventricular hemorrhage.  Review of systems is otherwise entirely negative except for an episode of drenching head sweats at 6 AM on waking that resolved.  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  LKW: 00:00  tPA given?: No, ICH Premorbid modified rankin scale:      0 - No symptoms.  ICH Score: 1  Time performed: 3:45 AM GCS: 13-15 is 0 points Infratentorial: No.. If yes, 1 point -- 0  Volume: <30cc is 0 points  Age: 40 y.o.. >80 is 1 point -- 0  Intraventricular extension is 1 point  Score: 1  A Score of 1 points has a 30 day mortality of 13%. Stroke. 2001 Apr;32(4):891-7.    ROS: All other review of systems was negative except as noted in the HPI.    Past Medical History:  Diagnosis Date  . Hypertension     No family history on file. Denies any family history of bleeding issues  Social History:  does not have a smoking history on file. He has never used smokeless tobacco. He reports current alcohol use. He reports that he does not use drugs. He  reports 2 drinks daily but denies any symptoms of withdrawal if he does not drink.  He reports he did not drink any alcohol today  Exam: Current vital signs: BP (!) 128/56 (BP Location: Left Arm)   Pulse 99   Temp 99.3 F (37.4 C) (Oral)   Resp 17   Ht 6' (1.829 m)   Wt (!) 142.9 kg   SpO2 100%   BMI 42.72 kg/m  Vital signs in last 24 hours: Temp:  [99.3 F (37.4 C)] 99.3 F (37.4 C) (05/21 0046) Pulse Rate:  [85-99] 99 (05/21 0248) Resp:  [17] 17 (05/21 0248) BP: (128-141)/(56-89) 128/56 (05/21 0248) SpO2:  [97 %-100 %] 100 % (05/21 0248) Weight:  [142.9 kg] 142.9 kg (05/21 0047)   Physical Exam  Constitutional: Appears well-developed and well-nourished.  Psych: Affect appropriate to situation, calm and cooperative Eyes: No scleral injection HENT: No oropharyngeal obstruction.  MSK: no joint deformities.  Cardiovascular: Normal rate and regular rhythm.  Respiratory: Effort normal, non-labored breathing GI: Soft.  No distension. There is no tenderness.  Skin: Warm dry and intact visible skin  Neuro: Mental Status: Patient is awake, alert, oriented to person, place, month, year, and situation. Patient is able to give a clear and coherent history. No signs of aphasia or neglect Cranial Nerves: II: Visual Fields are full. Pupils are equal, round, and reactive to light 4 to 2 mm.   III,IV,  VI: EOMI without ptosis or diploplia.  V: Facial sensation is symmetric to temperature VII: Facial movement is symmetric.  VIII: hearing is intact to voice X: Uvula elevates symmetrically XI: Shoulder shrug is symmetric. XII: tongue is midline without atrophy or fasciculations.  Motor: Tone is normal. Bulk is normal. 5/5 strength was present in all four extremities.  Sensory: Sensation is symmetric to light touch and temperature in the arms and legs. Deep Tendon Reflexes: 2+ and symmetric in the biceps and patellae.  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  Labetalol 10 mg IV as needed ordered - PT consult, OT consult, Speech consult when patient stabilized  - Appreciate neuro surgery following for potential need for intraventricular drain (patient aware of this possibility and would want a drain if needed) - Appreciate neurosurgery and/or neuro interventional radiology evaluation of AVM - Stroke team to follow  #Goals of care Discussed with patient potential need for drain with bridging with hypertonic  solutions if needed.  He agreed he would want all interventions including intubation and CPR if needed.  He did identify his wife as his primary decision maker and also provided the phone number of his mother (entered in the chart) should we have any difficulty reaching his wife.  Brooke Dare MD-PhD [facility] [phone] Available 7 PM to 7 AM, outside of these hours please call Neurologist on call as listed on Amion.   Total critical care time: 71 minutes   Critical care time was exclusive of separately billable procedures and treating other patients.   Critical care was necessary to treat or prevent imminent or life-threatening deterioration.   Critical care was time spent personally by me on the following activities: development of treatment plan with patient and/or surrogate as well as nursing, discussions with consultants/primary team, evaluation of patient's response to treatment, examination of patient, obtaining history from patient or surrogate, ordering and performing treatments and interventions, ordering and review of laboratory studies, ordering and review of radiographic studies, and re-evaluation of patient's condition as needed, as documented above.

## 2021-03-05 NOTE — ED Notes (Signed)
Patient now with emesis, CT called to get CT head next

## 2021-03-06 ENCOUNTER — Encounter (HOSPITAL_COMMUNITY): Payer: Self-pay | Admitting: Neurology

## 2021-03-06 LAB — GLUCOSE, CAPILLARY
Glucose-Capillary: 124 mg/dL — ABNORMAL HIGH (ref 70–99)
Glucose-Capillary: 117 mg/dL — ABNORMAL HIGH (ref 70–99)
Glucose-Capillary: 122 mg/dL — ABNORMAL HIGH (ref 70–99)

## 2021-03-06 MED ORDER — LABETALOL HCL 5 MG/ML IV SOLN
5.0000 mg | INTRAVENOUS | Status: DC | PRN
  Administered 2021-03-10: 5 mg via INTRAVENOUS
  Filled 2021-03-06: qty 4

## 2021-03-06 NOTE — Progress Notes (Signed)
Neurosurgery Service Progress Note  Subjective: No acute events overnight, no new complaints   Objective: Vitals:   03/06/21 1300 03/06/21 1400 03/06/21 1430 03/06/21 1500  BP: (!) 160/81 (!) 148/88 (!) 146/88 (!) 149/85  Pulse:      Resp: 12 18 10 13   Temp:      TempSrc:      SpO2:      Weight:      Height:        Physical Exam: AOx3, PERRL, EOMI, FS, TM, Strength 5/5 x4, SILTx4, no drift  Assessment & Plan: 40 y.o. 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  -NPO p MN for angiogram in AM -cont nimodipine -SBP<160 -SCDs/TEDs  40  03/06/21 4:16 PM

## 2021-03-06 NOTE — Evaluation (Signed)
Physical Therapy Evaluation & Discharge Patient Details Name: Harold Bright MRN: [id number] DOB: 1981-09-25 Today's Date: 03/06/2021   History of Present Illness  Pt is a 40 y.o. male who presented 5/21 with sudden onset headache, visual disturbance, and emesis. 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 PMH: HTN and ocular migraines.    Clinical Impression  Pt presents with condition above. PTA, he was living with his wife and 91 y.o. daughter in a multi-level house. 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 Pt appears to be at his baseline cognitively and functionally, displaying safe balance and mobility even with challenges of a DGI or max reaching off BOS. He is not at risk for falls, as is supported by his DGI score of 24 this date. His bil UEs and lower extremities displayed intact coordination, intact sensation, and symmetrical WNL strength. All education completed and questions answered. PT will sign off at this time, thank you for this referral.     Follow Up Recommendations No PT follow up    Equipment Recommendations  None recommended by PT    Recommendations for Other Services       Precautions / Restrictions Precautions Precautions: Fall Precaution Comments: SBP < 160 Restrictions Weight Bearing Restrictions: No      Mobility  Bed Mobility Overal bed mobility: Independent             General bed mobility comments: Pt able to perform all bed mobility with bed flat and no use of rails safely in appropriate time frame.    Transfers Overall transfer level: Independent Equipment used: None             General transfer comment: Pt able to perform transfers safely in appropriate time frame.  Ambulation/Gait Ambulation/Gait assistance: Supervision Gait Distance (Feet): 350 Feet Assistive device: None Gait Pattern/deviations: WFL(Within Functional  Limits) Gait velocity: WNL Gait velocity interpretation: >4.37 ft/sec, indicative of normal walking speed General Gait Details: No apparent asymmetry or excessive gait deviations, WFL. No LOB, supervision for safety.  Stairs Stairs: Yes Stairs assistance: Min guard;Supervision Stair Management: One rail Left;One rail Right;Alternating pattern;Forwards;No rails Number of Stairs: 4 General stair comments: First 2 stairs with L rail ascending and R rail descending and final 2 stairs no rails. reciprocal pattern, no LOB. Min guard-supervision for safety.  Wheelchair Mobility    Modified Rankin (Stroke Patients Only) Modified Rankin (Stroke Patients Only) Pre-Morbid Rankin Score: No symptoms Modified Rankin: No symptoms     Balance Overall balance assessment: No apparent balance deficits (not formally assessed)                               Standardized Balance Assessment Standardized Balance Assessment : Dynamic Gait Index   Dynamic Gait Index Level Surface: Normal Change in Gait Speed: Normal Gait with Horizontal Head Turns: Normal Gait with Vertical Head Turns: Normal Gait and Pivot Turn: Normal Step Over Obstacle: Normal Step Around Obstacles: Normal Steps: Normal Total Score: 24       Pertinent Vitals/Pain Pain Assessment: Faces Faces Pain Scale: Hurts a little bit Pain Location: headache, neck with stretching Pain Descriptors / Indicators: Headache;Discomfort (stretch) Pain Intervention(s): Limited activity within patient's tolerance;Monitored during session;Repositioned    Home Living Family/patient expects to be discharged to:: Private residence Living Arrangements: Spouse/significant other;Children (4 y.o. daughter) Available Help at Discharge: Family;Available 24 hours/day  Type of Home: House Home Access: Stairs to enter Entrance Stairs-Rails: None Entrance Stairs-Number of Steps: 3 Home Layout: Two level;Bed/bath upstairs;Able to live on main  level with bedroom/bathroom Home Equipment: Shower seat - built in;Hand held shower head      Prior Function Level of Independence: Independent         Comments: Drives. Writes software for work.     Hand Dominance   Dominant Hand: Right    Extremity/Trunk Assessment   Upper Extremity Assessment Upper Extremity Assessment: Overall WFL for tasks assessed (sensation and coordination intact bil; WNL and symmetrical strength bil)    Lower Extremity Assessment Lower Extremity Assessment: Overall WFL for tasks assessed (sensation and coordination intact bil; WNL and symmetrical strength bil)    Cervical / Trunk Assessment Cervical / Trunk Assessment: Normal  Communication   Communication: No difficulties  Cognition Arousal/Alertness: Awake/alert Behavior During Therapy: WFL for tasks assessed/performed Overall Cognitive Status: Within Functional Limits for tasks assessed                                        General Comments General comments (skin integrity, edema, etc.): Educated pt on neck stretches; educated pt and his wife on taking it easy with activity and avoiding stressors going home and to listen to his body and his headache symptom to monitor when he over-exerts himself    Exercises     Assessment/Plan    PT Assessment Patent does not need any further PT services  PT Problem List         PT Treatment Interventions      PT Goals (Current goals can be found in the Care Plan section)  Acute Rehab PT Goals Patient Stated Goal: to go home PT Goal Formulation: With patient/family Time For Goal Achievement: 03/07/21 Potential to Achieve Goals: Good    Frequency     Barriers to discharge        Co-evaluation               AM-PAC PT "6 Clicks" Mobility  Outcome Measure Help needed turning from your back to your side while in a flat bed without using bedrails?: None Help needed moving from lying on your back to sitting on the side  of a flat bed without using bedrails?: None Help needed moving to and from a bed to a chair (including a wheelchair)?: None Help needed standing up from a chair using your arms (e.g., wheelchair or bedside chair)?: None Help needed to walk in hospital room?: None Help needed climbing 3-5 steps with a railing? : A Little 6 Click Score: 23    End of Session Equipment Utilized During Treatment: Gait belt Activity Tolerance: Patient tolerated treatment well Patient left: in chair;with family/visitor present Nurse Communication: Mobility status (RN aware of no chair alarm) PT Visit Diagnosis: Other symptoms and signs involving the nervous system (R29.898)    Time: 7209-4709 PT Time Calculation (min) (ACUTE ONLY): 28 min   Charges:   PT Evaluation $PT Eval Low Complexity: 1 Low PT Treatments $Gait Training: 8-22 mins        Raymond Gurney, PT, DPT Acute Rehabilitation Services  Pager: [phone] Office: [phone]   Jewel Baize 03/06/2021, 5:29 PM

## 2021-03-06 NOTE — Progress Notes (Signed)
STROKE TEAM PROGRESS NOTE   INTERVAL HISTORY No acute events  C/o headaches intermittently last pm. No other symptoms of concern.  He has no complaints of reading or writing today. Patient is scheduled for diagnostic angiogram and possible treatment for AVM tomorrow by Dr. Conchita ParisNundkumar. Vitals:   03/06/21 0800 03/06/21 0900 03/06/21 1100 03/06/21 1200  BP: 135/77 131/88 129/81 139/84  Pulse: 97 85    Resp:    12  Temp: 98.6 F (37 C)   98.6 F (37 C)  TempSrc: Oral   Oral  SpO2: 98% 96%    Weight:      Height:       CBC:  Recent Labs  Lab 03/05/21 0237 03/05/21 0255  WBC 9.2  --   NEUTROABS 6.6  --   HGB 13.6 16.3  HCT 41.0 48.0  MCV 92.6  --   PLT 299  --    Basic Metabolic Panel:  Recent Labs  Lab 03/05/21 0237 03/05/21 0255  NA 135 138  K 3.9 3.5  CL 100 103  CO2 25  --   GLUCOSE 162* 120*  BUN 16 11  CREATININE 1.14 1.00  CALCIUM 9.0  --    Lipid Panel:  Recent Labs  Lab 03/05/21 0442  CHOL 146  TRIG 42  HDL 47  CHOLHDL 3.1  VLDL 8  LDLCALC 91   HgbA1c:  Recent Labs  Lab 03/05/21 0442  HGBA1C 5.7*   Urine Drug Screen:  Recent Labs  Lab 03/05/21 0304  LABOPIA NONE DETECTED  COCAINSCRNUR NONE DETECTED  LABBENZ NONE DETECTED  AMPHETMU NONE DETECTED  THCU NONE DETECTED  LABBARB NONE DETECTED    Alcohol Level  Recent Labs  Lab 03/05/21 0442  ETH <10    IMAGING past 24 hours ECHOCARDIOGRAM COMPLETE  Result Date: 03/05/2021    ECHOCARDIOGRAM REPORT   Patient Name:   [facility]EN [facility]EL [facility] Date of Exam: 03/05/2021 Medical Rec #:  [id number]               Height:       72.0 in Accession #:    [id number]              Weight:       322.1 lb Date of Birth:  10/12/1981                BSA:          2.609 m Patient Age:    40 years                BP:           113/60 mmHg Patient Gender: M                       HR:           94 bpm. Exam Location:  Inpatient Procedure: 2D Echo, Cardiac Doppler and Color Doppler Indications:    Stroke  History:         Patient has no prior history of Echocardiogram examinations.                 Risk Factors:Hypertension.  Sonographer:    Shirlean KellyJohn Mendel Brown Referring Phys: [id number] SRISHTI L BHAGAT  Sonographer Comments: Technically difficult study due to poor echo windows. IMPRESSIONS  1. Left ventricular ejection fraction, by estimation, is 60 to 65%. The left ventricle has normal function. The left ventricle has no regional wall motion abnormalities. Left ventricular diastolic  parameters are consistent with Grade I diastolic dysfunction (impaired relaxation).  2. Right ventricular systolic function is normal. The right ventricular size is normal.  3. The mitral valve is abnormal. Trivial mitral valve regurgitation.  4. The aortic valve is tricuspid. Aortic valve regurgitation is not visualized. Conclusion(s)/Recommendation(s): No intracardiac source of embolism detected on this transthoracic study. A transesophageal echocardiogram with bubble study is recommended to exclude cardiac source of embolism if clinically indicated. FINDINGS  Left Ventricle: Left ventricular ejection fraction, by estimation, is 60 to 65%. The left ventricle has normal function. The left ventricle has no regional wall motion abnormalities. The left ventricular internal cavity size was normal in size. There is  no left ventricular hypertrophy. Left ventricular diastolic parameters are consistent with Grade I diastolic dysfunction (impaired relaxation). Normal left ventricular filling pressure. Right Ventricle: The right ventricular size is normal. No increase in right ventricular wall thickness. Right ventricular systolic function is normal. Left Atrium: Left atrial size was normal in size. Right Atrium: Right atrial size was normal in size. Pericardium: There is no evidence of pericardial effusion. Mitral Valve: The mitral valve is abnormal. There is mild thickening of the mitral valve leaflet(s). Trivial mitral valve regurgitation. Tricuspid Valve: The  tricuspid valve is grossly normal. Tricuspid valve regurgitation is trivial. Aortic Valve: The aortic valve is tricuspid. Aortic valve regurgitation is not visualized. Aortic valve mean gradient measures 6.0 mmHg. Aortic valve peak gradient measures 10.5 mmHg. Aortic valve area, by VTI measures 3.30 cm. Pulmonic Valve: The pulmonic valve was normal in structure. Pulmonic valve regurgitation is not visualized. Aorta: The aortic root and ascending aorta are structurally normal, with no evidence of dilitation. IAS/Shunts: The interatrial septum was not well visualized.  LEFT VENTRICLE PLAX 2D LVIDd:         5.60 cm  Diastology LVIDs:         3.80 cm  LV e' medial:    9.46 cm/s LV PW:         1.00 cm  LV E/e' medial:  8.2 LV IVS:        0.80 cm  LV e' lateral:   13.50 cm/s LVOT diam:     2.40 cm  LV E/e' lateral: 5.8 LV SV:         91 LV SV Index:   35 LVOT Area:     4.52 cm  RIGHT VENTRICLE RV Basal diam:  3.00 cm RV S prime:     14.10 cm/s TAPSE (M-mode): 2.0 cm LEFT ATRIUM             Index       RIGHT ATRIUM           Index LA diam:        4.00 cm 1.53 cm/m  RA Area:     11.40 cm LA Vol (A2C):   73.7 ml 28.25 ml/m RA Volume:   23.00 ml  8.82 ml/m LA Vol (A4C):   87.1 ml 33.39 ml/m LA Biplane Vol: 83.5 ml 32.01 ml/m  AORTIC VALVE AV Area (Vmax):    3.21 cm AV Area (Vmean):   3.20 cm AV Area (VTI):     3.30 cm AV Vmax:           162.00 cm/s AV Vmean:          109.000 cm/s AV VTI:            0.277 m AV Peak Grad:      10.5 mmHg AV Mean  Grad:      6.0 mmHg LVOT Vmax:         115.00 cm/s LVOT Vmean:        77.100 cm/s LVOT VTI:          0.202 m LVOT/AV VTI ratio: 0.73  AORTA Ao Root diam: 3.50 cm Ao Asc diam:  3.30 cm MITRAL VALVE MV Area (PHT): 4.39 cm    SHUNTS MV Decel Time: 173 msec    Systemic VTI:  0.20 m MV E velocity: 77.70 cm/s  Systemic Diam: 2.40 cm MV A velocity: 70.40 cm/s MV E/A ratio:  1.10 Zoila Shutter MD Electronically signed by Zoila Shutter MD Signature Date/Time: 03/05/2021/4:51:12 PM     Final        PHYSICAL EXAM Constitutional: Pleasant middle-age Caucasian male appears well-developed and well-nourished.  Psych: Affect appropriate to situation,calm and cooperative HENT: No oropharyngeal obstruction.  MSK: no joint deformities.  Cardiovascular: Normal rate and regular rhythm.  Respiratory: Effort normal, non-labored breathing Skin: Warm dry and intact visible skin  Neuro: Mental Status: Patient is awake, alert, oriented to person, place, month, year, and situation. Patient is able to give a clear and coherent history. No signs of aphasia or neglect Cranial Nerves: II: Visual Fields are full. Pupils are equal, round, and reactive to light4 to 2 mm.  III,IV, VI: EOMI without ptosis or diploplia.  V: Facial sensation is symmetric to temperature VII: Facial movement is symmetric.  VIII: hearing is intact to voice X: Uvula elevates symmetrically XI: Shoulder shrug is symmetric. XII: tongue is midline without atrophy or fasciculations.  Motor: Tone is normal. Bulk is normal. 5/5 strength was present in all four extremities.  Sensory: Sensation is symmetric to light touch and temperature in the arms and legs. Cerebellar: FNF and HKS are intact bilaterally  ASSESSMENT/PLAN Margaret Staggs Lobergeris a 39 y.o.malewith a past medical history significant for hypertension, ocular migraines, hyperlipidemia, seasonal allergies, not on anticoagulant or antiplatelet medications presenting with sudden onset worst headache of his lifeand visual disturbance consistent with alexia without agraphia.   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 Slight prominence of theventricles unchanged from CT earlier today.   2D Echo EF 65%, The interatrial septum was not well visualized. Mild thickening of  the mitral valve leaflet(s). Trivial mitral valve regurgitation. No thrombus. No wall motion abnormality.  LDL 91  HgbA1c 5.7  VTE prophylaxis - per primary team         Diet   Diet Carb Modified Fluid consistency: Thin; Room service appropriate? 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  Neurosurgery managing and formulating plan of care   On [place]   Hypertension  Stable  BP goal less than  160  Long-term BP goal normotensive  Hyperlipidemia  Home meds: resumed in hospital  LDL 91, not at goal < 70  High intensity statin: consider at discharge  Continue statin at discharge  Other Stroke Risk Factors  Current ETOH use, alcohol level <10, advised to drink no more than 2 drink(s) a day   Obesity, Body mass index is 43.68 kg/m., BMI >/= 30 associated with increased stroke risk, recommend weight loss, diet and exercise as appropriate   Hx of ocular migraines   High risk for Obstructive sleep apnea  Other Active Problems I have personally obtained history,examined this patient, reviewed notes, independently viewed imaging studies, participated in medical decision making and plan of care.ROS completed by me personally and pertinent positives fully documented  I have made any additions or clarifications directly to the above  note. Agree with note above.  Continue strict blood pressure control.  Diagnostic angiogram tomorrow morning with treatment decision about possible endovascular treatment versus surgery to be made after that.  Long discussion patient and wife  at the bedside and answered questions.This patient is critically ill and at significant risk of neurological worsening, death and care requires constant monitoring of vital signs, hemodynamics,respiratory and cardiac monitoring, extensive review of multiple databases, frequent neurological assessment, discussion with family, other specialists and medical decision making of high complexity.I have made any additions or clarifications directly to the above note.This critical care time does not reflect procedure time, or teaching time or supervisory time of PA/NP/Med Resident etc but could involve care discussion time.  I spent 30 minutes of neurocritical care time  in the care of  this patient.       Delia Heady, MD Medical Director [facility] Pager: [phone] 03/06/2021 3:19 PM

## 2021-03-07 NOTE — Progress Notes (Signed)
STROKE TEAM PROGRESS NOTE   INTERVAL HISTORY No acute events.  His wife is at the bedside. No headaches now.  No other symptoms of concern.  He has no complaints of reading or writing today. Patient is scheduled for diagnostic angiogram and possible treatment for AVM today by Dr. Conchita Paris. Vitals:   03/07/21 0900 03/07/21 1000 03/07/21 1100 03/07/21 1200  BP: (!) 145/87 134/83 (!) 129/91 133/82  Pulse: 66 67 63 80  Resp: 12 (!) 7 11 10   Temp:    98.4 F (36.9 C)  TempSrc:    Oral  SpO2: 97% 95% 95% 95%  Weight:      Height:       CBC:  Recent Labs  Lab 03/05/21 0237 03/05/21 0255  WBC 9.2  --   NEUTROABS 6.6  --   HGB 13.6 16.3  HCT 41.0 48.0  MCV 92.6  --   PLT 299  --    Basic Metabolic Panel:  Recent Labs  Lab 03/05/21 0237 03/05/21 0255  NA 135 138  K 3.9 3.5  CL 100 103  CO2 25  --   GLUCOSE 162* 120*  BUN 16 11  CREATININE 1.14 1.00  CALCIUM 9.0  --    Lipid Panel:  Recent Labs  Lab 03/05/21 0442  CHOL 146  TRIG 42  HDL 47  CHOLHDL 3.1  VLDL 8  LDLCALC 91   HgbA1c:  Recent Labs  Lab 03/05/21 0442  HGBA1C 5.7*   Urine Drug Screen:  Recent Labs  Lab 03/05/21 0304  LABOPIA NONE DETECTED  COCAINSCRNUR NONE DETECTED  LABBENZ NONE DETECTED  AMPHETMU NONE DETECTED  THCU NONE DETECTED  LABBARB NONE DETECTED    Alcohol Level  Recent Labs  Lab 03/05/21 0442  ETH <10    IMAGING past 24 hours No results found.     PHYSICAL EXAM Constitutional: Pleasant middle-age Caucasian male appears well-developed and well-nourished.  Psych: Affect appropriate to situation,calm and cooperative HENT: No oropharyngeal obstruction.  MSK: no joint deformities.  Cardiovascular: Normal rate and regular rhythm.  Respiratory: Effort normal, non-labored breathing Skin: Warm dry and intact visible skin  Neuro: Mental Status: Patient is awake, alert, oriented to person, place, month, year, and situation. Patient is able to give a clear and coherent  history. No signs of aphasia or neglect Cranial Nerves: II: Visual Fields are full. Pupils are equal, round, and reactive to light4 to 2 mm.  III,IV, VI: EOMI without ptosis or diploplia.  V: Facial sensation is symmetric to temperature VII: Facial movement is symmetric.  VIII: hearing is intact to voice X: Uvula elevates symmetrically XI: Shoulder shrug is symmetric. XII: tongue is midline without atrophy or fasciculations.  Motor: Tone is normal. Bulk is normal. 5/5 strength was present in all four extremities.  Sensory: Sensation is symmetric to light touch and temperature in the arms and legs. Cerebellar: FNF and HKS are intact bilaterally  ASSESSMENT/PLAN Harold Rembold Lobergeris a 39 y.o.malewith a past medical history significant for hypertension, ocular migraines, hyperlipidemia, seasonal allergies, not on anticoagulant or antiplatelet medications presenting with sudden onset worst headache of his lifeand visual disturbance consistent with alexia without agraphia.   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 Slight prominence of theventricles unchanged from CT earlier today.   2D Echo EF 65%, The interatrial septum was not well visualized. Mild thickening of  the mitral valve leaflet(s). Trivial mitral valve regurgitation. No thrombus. No wall motion abnormality.  LDL 91  HgbA1c  5.7  VTE prophylaxis - per primary team         Diet   Diet Carb Modified Fluid consistency: Thin; Room service appropriate? 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  Neurosurgery managing and formulating plan of care   On [place]   Hypertension  Stable  BP goal less than 160  Long-term BP goal normotensive  Hyperlipidemia  Home meds: resumed in hospital  LDL 91, not at goal < 70  High intensity statin: consider at discharge  Continue statin at discharge  Other Stroke Risk Factors  Current ETOH use, alcohol level <10, advised to drink no more than 2 drink(s) a day   Obesity, Body mass index is 43.68 kg/m., BMI >/= 30 associated with increased stroke risk, recommend weight loss, diet and exercise as appropriate   Hx of ocular migraines   High risk for Obstructive sleep apnea  Other Active Problems    Continue strict blood pressure control.  Diagnostic angiogram tomorrow morning with treatment decision about possible endovascular treatment versus surgery to be made after that.  Long discussion patient and wife  at the bedside and answered questions.This patient is critically ill and at significant risk of neurological worsening, death and care requires constant monitoring of vital signs, hemodynamics,respiratory and cardiac monitoring, extensive review of multiple databases, frequent neurological assessment, discussion with family, other specialists and medical decision making of high complexity.I have made any additions or clarifications directly to the above note.This critical care time does not reflect procedure time, or teaching time or supervisory time of PA/NP/Med Resident etc but could involve care discussion time.  I spent 30  minutes of neurocritical care time  in the care of  this patient.       Delia Heady, MD Medical Director [facility] Pager: [phone] 03/07/2021 3:36 PM

## 2021-03-07 NOTE — Progress Notes (Signed)
Neurosurgery Service Progress Note  Subjective: No acute events overnight, no new complaints   Objective: Vitals:   03/07/21 0400 03/07/21 0500 03/07/21 0600 03/07/21 0700  BP: 117/73 (!) 106/50 120/74 121/73  Pulse: 64 62    Resp: (!) 9 (!) 7 (!) 9 11  Temp: 98.7 F (37.1 C)     TempSrc: Oral     SpO2: 98% 95%  100%  Weight:      Height:        Physical Exam: AOx3, PERRL, EOMI, FS, TM, Strength 5/5 x4, SILTx4, no drift  Assessment & Plan: 40 y.o. 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  -catheter angiogram today to determine Tx plan, can resume regular diet after angio -cont nimodipine -SBP<160 -SCDs/TEDs  Jadene Pierini  03/07/21 7:56 AM

## 2021-03-07 NOTE — Progress Notes (Addendum)
Per Dr. Maurice Small, Dr. Conchita Paris is unable to perform angiogram today. Verbal order for a regular diet and NPO after midnight. I updated patient at this time.

## 2021-03-07 NOTE — Progress Notes (Signed)
OT Cancellation Note  Patient Details Name: Harold Bright MRN: [id number] DOB: 02-19-1981   Cancelled Treatment:    Reason Eval/Treat Not Completed: OT screened, no needs identified, will sign off. PT reports he feels near baseline. Was able to perform sponge bath at sink independent per Pt and RN. Provided education on BEFAST; pt verbalized understanding. Will sign off. Thank you.  Please reconsult if status changes.   Loy Mccartt M Payton Prinsen Laelynn Blizzard MSOT, OTR/L Acute Rehab Pager: [phone] Office: [phone] 03/07/2021, 3:11 PM

## 2021-03-08 MED ORDER — HEPARIN SODIUM (PORCINE) 5000 UNIT/ML IJ SOLN
5000.0000 [IU] | Freq: Three times a day (TID) | INTRAMUSCULAR | Status: DC
  Administered 2021-03-08 – 2021-03-11 (×8): 5000 [IU] via SUBCUTANEOUS
  Filled 2021-03-08 (×8): qty 1

## 2021-03-08 NOTE — Progress Notes (Signed)
Neurosurgery Service Progress Note  Subjective: No acute events overnight, no new complaints   Objective: Vitals:   03/08/21 1500 03/08/21 1600 03/08/21 1700 03/08/21 1800  BP: 138/79 (!) 153/85  131/70  Pulse: 69 66 78 69  Resp: 13 (!) 8 14 (!) 9  Temp:  98.9 F (37.2 C)    TempSrc:  Oral    SpO2: 98% 96% 96% 95%  Weight:      Height:        Physical Exam: AOx3, PERRL, EOMI, FS, TM, Strength 5/5 x4, SILTx4, no drift  Assessment & Plan: 40 y.o. 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  -catheter angiogram today to determine Tx plan, can resume regular diet after angio -cont nimodipine -TCDs tomorrow and Friday -SBP<160 -SCDs/TEDs, Rodrigo Ran  03/08/21 6:16 PM

## 2021-03-08 NOTE — Progress Notes (Signed)
Spoke with Dr. Conchita Paris on the phone and asked for me to relay to the patient that the angiogram will be done tomorrow afternoon at the earliest. Patient is ok to eat dinner and breakfast tomorrow morning but needs to be NPO after breakfast. Patient notified at this time.

## 2021-03-08 NOTE — Progress Notes (Signed)
STROKE TEAM PROGRESS NOTE   INTERVAL HISTORY No acute events.  His wife and mom  are at the bedside.    He has no complaints   today.  Blood pressure adequately controlled Patient is scheduled for diagnostic angiogram and possible treatment for AVM today by Dr. Conchita Paris. Vitals:   03/08/21 1000 03/08/21 1100 03/08/21 1200 03/08/21 1300  BP: 129/79  118/61 132/74  Pulse: 62 (!) 59 60 61  Resp: (!) 5 10 10 14   Temp:   98.6 F (37 C)   TempSrc:   Oral   SpO2: 96% 96% 96% 94%  Weight:      Height:       CBC:  Recent Labs  Lab 03/05/21 0237 03/05/21 0255  WBC 9.2  --   NEUTROABS 6.6  --   HGB 13.6 16.3  HCT 41.0 48.0  MCV 92.6  --   PLT 299  --    Basic Metabolic Panel:  Recent Labs  Lab 03/05/21 0237 03/05/21 0255  NA 135 138  K 3.9 3.5  CL 100 103  CO2 25  --   GLUCOSE 162* 120*  BUN 16 11  CREATININE 1.14 1.00  CALCIUM 9.0  --    Lipid Panel:  Recent Labs  Lab 03/05/21 0442  CHOL 146  TRIG 42  HDL 47  CHOLHDL 3.1  VLDL 8  LDLCALC 91   HgbA1c:  Recent Labs  Lab 03/05/21 0442  HGBA1C 5.7*   Urine Drug Screen:  Recent Labs  Lab 03/05/21 0304  LABOPIA NONE DETECTED  COCAINSCRNUR NONE DETECTED  LABBENZ NONE DETECTED  AMPHETMU NONE DETECTED  THCU NONE DETECTED  LABBARB NONE DETECTED    Alcohol Level  Recent Labs  Lab 03/05/21 0442  ETH <10    IMAGING past 24 hours No results found.     PHYSICAL EXAM Constitutional: Pleasant middle-age Caucasian male appears well-developed and well-nourished.  Psych: Affect appropriate to situation,calm and cooperative HENT: No oropharyngeal obstruction.  MSK: no joint deformities.  Cardiovascular: Normal rate and regular rhythm.  Respiratory: Effort normal, non-labored breathing Skin: Warm dry and intact visible skin  Neuro: Mental Status: Patient is awake, alert, oriented to person, place, month, year, and situation. Patient is able to give a clear and coherent history. No signs of aphasia  or neglect Cranial Nerves: II: Visual Fields are full. Pupils are equal, round, and reactive to light4 to 2 mm.  III,IV, VI: EOMI without ptosis or diploplia.  V: Facial sensation is symmetric to temperature VII: Facial movement is symmetric.  VIII: hearing is intact to voice X: Uvula elevates symmetrically XI: Shoulder shrug is symmetric. XII: tongue is midline without atrophy or fasciculations.  Motor: Tone is normal. Bulk is normal. 5/5 strength was present in all four extremities.  Sensory: Sensation is symmetric to light touch and temperature in the arms and legs. Cerebellar: FNF and HKS are intact bilaterally  ASSESSMENT/PLAN Harold Macnaughton Lobergeris a 39 y.o.malewith a past medical history significant for hypertension, ocular migraines, hyperlipidemia, seasonal allergies, not on anticoagulant or antiplatelet medications presenting with sudden onset worst headache of his lifeand visual disturbance consistent with alexia without agraphia.   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 Slight prominence of theventricles unchanged from CT earlier today.   2D Echo EF 65%, The interatrial septum was not well visualized. Mild thickening of  the mitral valve leaflet(s). Trivial mitral valve regurgitation. No thrombus. No wall motion abnormality.  LDL 91  HgbA1c 5.7  VTE prophylaxis - per  primary team         Diet   Diet Carb Modified Fluid consistency: Thin; Room service appropriate? 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  Neurosurgery managing and formulating plan of care   On [place]   Hypertension  Stable  BP goal less than 160  Long-term BP goal normotensive  Hyperlipidemia  Home meds: resumed in hospital  LDL 91, not at goal < 70  High intensity statin: consider at discharge  Continue statin at discharge  Other Stroke Risk Factors  Current ETOH use, alcohol level <10, advised to drink no more than 2 drink(s) a day   Obesity, Body mass index is 43.68 kg/m., BMI >/= 30 associated with increased stroke risk, recommend weight loss, diet and exercise as appropriate   Hx of ocular migraines   High risk for Obstructive sleep apnea  Other Active Problems    Continue strict blood pressure control.  Diagnostic angiogram hopefully today with treatment decision about possible endovascular treatment versus surgery no significant subarachnoid blood to worry about vasospasm.  Radiation treatment to be made after that.  Recommend discontinuing amlodipine long discussion patient and wife  at the bedside and answered questions.discussed with Dr. Conchita Paris.  This patient is critically ill and at significant risk of neurological worsening, death and care requires constant monitoring of vital signs, hemodynamics,respiratory and cardiac monitoring, extensive review of multiple databases, frequent neurological assessment, discussion with family, other specialists and medical decision making of high complexity.I have made any additions or clarifications directly to the above note.This critical care time does not reflect procedure time, or  teaching time or supervisory time of PA/NP/Med Resident etc but could involve care discussion time.  I spent 30 minutes of neurocritical care time  in the care of  this patient.       Delia Heady, MD Medical Director [facility] Pager: [phone] 03/08/2021 2:49 PM

## 2021-03-09 ENCOUNTER — Inpatient Hospital Stay (HOSPITAL_COMMUNITY): Payer: Managed Care, Other (non HMO)

## 2021-03-09 NOTE — Progress Notes (Signed)
Neurosurgery Service Progress Note  Subjective: No acute events overnight, was doing well but after a few minutes of discussion on rounds, sat up and felt like he was going to vomit  Objective: Vitals:   03/09/21 0300 03/09/21 0400 03/09/21 0500 03/09/21 0600  BP: 125/63 115/64 130/83 137/79  Pulse: 61 61 62 (!) 57  Resp: 14 18 12  (!) 31  Temp:  99.3 F (37.4 C)    TempSrc:  Oral    SpO2: 93% 93% 93% 93%  Weight:      Height:        Physical Exam: AOx3, PERRL, EOMI, FS, TM, Strength 5/5 x4, SILTx4, no drift  Assessment & Plan: 40 y.o. 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  -catheter angiogram today to determine Tx plan, can resume regular diet after angio -CTH to re-eval ventricular size -TCDs today and Friday -SBP<160 -SCDs/TEDs, [facility]  Saturday  03/09/21 8:54 AM

## 2021-03-09 NOTE — Progress Notes (Signed)
STROKE TEAM PROGRESS NOTE   INTERVAL HISTORY No acute events except patient complaining of nausea this morning and had a stat CT scan of the head which actually shows improvement in the intraventricular blood and hydrocephalus.  He seems fine this afternoon and has no complaints patient is scheduled for diagnostic angiogram and possible treatment for AVM now likely tomorrow by Dr. Conchita Paris.  Vital signs are stable.  Blood pressure adequately controlled.  Neurological exam is unchanged Vitals:   03/09/21 0800 03/09/21 0900 03/09/21 1200 03/09/21 1300  BP: (!) 152/100 (!) 156/104  136/80  Pulse: 70 80  69  Resp: 15 (!) 24  13  Temp: 100 F (37.8 C)  98.6 F (37 C)   TempSrc: Oral  Oral   SpO2: 99% 98%  95%  Weight:      Height:       CBC:  Recent Labs  Lab 03/05/21 0237 03/05/21 0255  WBC 9.2  --   NEUTROABS 6.6  --   HGB 13.6 16.3  HCT 41.0 48.0  MCV 92.6  --   PLT 299  --    Basic Metabolic Panel:  Recent Labs  Lab 03/05/21 0237 03/05/21 0255  NA 135 138  K 3.9 3.5  CL 100 103  CO2 25  --   GLUCOSE 162* 120*  BUN 16 11  CREATININE 1.14 1.00  CALCIUM 9.0  --    Lipid Panel:  Recent Labs  Lab 03/05/21 0442  CHOL 146  TRIG 42  HDL 47  CHOLHDL 3.1  VLDL 8  LDLCALC 91   HgbA1c:  Recent Labs  Lab 03/05/21 0442  HGBA1C 5.7*   Urine Drug Screen:  Recent Labs  Lab 03/05/21 0304  LABOPIA NONE DETECTED  COCAINSCRNUR NONE DETECTED  LABBENZ NONE DETECTED  AMPHETMU NONE DETECTED  THCU NONE DETECTED  LABBARB NONE DETECTED    Alcohol Level  Recent Labs  Lab 03/05/21 0442  ETH <10    IMAGING past 24 hours CT HEAD WO CONTRAST  Result Date: 03/09/2021 CLINICAL DATA:  Hydrocephalus.  Intracranial hemorrhage EXAM: CT HEAD WITHOUT CONTRAST TECHNIQUE: Contiguous axial images were obtained from the base of the skull through the vertex without intravenous contrast. COMPARISON:  CT head 03/05/2021 FINDINGS: Brain: Intraventricular hemorrhage left greater than  right has improved. The no new hemorrhage. Mild ventricular enlargement stable from the prior study. 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 No acute infarct. Vascular: Negative for hyperdense vessel. 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 Skull: Negative Sinuses/Orbits: Minimal mucosal edema paranasal sinuses. Negative orbit Other: None IMPRESSION: Improving intraventricular hemorrhage. No new hemorrhage. 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 Electronically Signed   By: Marlan Palau M.D.   On: 03/09/2021 10:24       PHYSICAL EXAM Constitutional: Pleasant middle-age Caucasian male appears well-developed and well-nourished.  Psych: Affect appropriate to situation,calm and cooperative HENT: No oropharyngeal obstruction.  MSK: no joint deformities.  Cardiovascular: Normal rate and regular rhythm.  Respiratory: Effort normal, non-labored breathing Skin: Warm dry and intact visible skin  Neuro: Mental Status: Patient is awake, alert, oriented to person, place, month, year, and situation. Patient is able to give a clear and coherent history. No signs of aphasia or neglect Cranial Nerves: II: Visual Fields are full. Pupils are equal, round, and reactive to light4 to 2 mm.  III,IV, VI: EOMI without ptosis or diploplia.  V: Facial sensation is symmetric  to temperature VII: Facial movement is symmetric.  VIII: hearing is intact to voice X: Uvula elevates symmetrically XI: Shoulder shrug is symmetric. XII: tongue is midline without atrophy or fasciculations.  Motor: Tone is normal. Bulk is normal. 5/5 strength was present in all four extremities.  Sensory: Sensation is symmetric to light touch and temperature in the arms and legs. Cerebellar: FNF and HKS are intact bilaterally  ASSESSMENT/PLAN Harold Riel Lobergeris a 40 y.o.malewith a past medical history significant for  hypertension, ocular migraines, hyperlipidemia, seasonal allergies, not on anticoagulant or antiplatelet medications presenting with sudden onset worst headache of his lifeand visual disturbance consistent with alexia without agraphia.   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 Slight prominence of theventricles unchanged from CT earlier today.   2D Echo EF 65%, The interatrial septum was not well visualized. Mild thickening of  the mitral valve leaflet(s). Trivial mitral valve regurgitation. No thrombus. No wall motion abnormality.  LDL 91  HgbA1c 5.7  VTE prophylaxis - per primary team         Diet   Diet Carb Modified Fluid consistency: Thin; Room service appropriate? 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  Neurosurgery managing and formulating plan of care   On [place]   Hypertension  Stable  BP goal less than 160  Long-term BP goal normotensive  Hyperlipidemia  Home meds: resumed in hospital  LDL 91, not at goal < 70  High intensity statin: consider at discharge  Continue statin at discharge  Other Stroke Risk Factors  Current ETOH use,  alcohol level <10, advised to drink no more than 2 drink(s) a day   Obesity, Body mass index is 43.68 kg/m., BMI >/= 30 associated with increased stroke risk, recommend weight loss, diet and exercise as appropriate   Hx of ocular migraines   High risk for Obstructive sleep apnea  Other Active Problems    Continue strict blood pressure control.  Diagnostic angiogram hopefully tomorrow with treatment decision about possible endovascular treatment versus surgery or radiation treatment to be made after that.  ibuprofen had a  long discussion patient and wife  at the bedside and answered questions.greater than 50% time during the 35-minute visit was spent on counseling and coordination of care and discussion with patient wife and care team and answering questions.      Delia Heady, MD Medical Director [facility] Pager: [phone] 03/09/2021 3:34 PM

## 2021-03-10 ENCOUNTER — Inpatient Hospital Stay (HOSPITAL_COMMUNITY): Payer: Managed Care, Other (non HMO)

## 2021-03-10 ENCOUNTER — Other Ambulatory Visit: Payer: Self-pay | Admitting: Interventional Radiology

## 2021-03-10 DIAGNOSIS — I615 Nontraumatic intracerebral hemorrhage, intraventricular: Secondary | ICD-10-CM

## 2021-03-10 HISTORY — PX: IR ANGIO INTRA EXTRACRAN SEL INTERNAL CAROTID BILAT MOD SED: IMG5363

## 2021-03-10 HISTORY — PX: IR ANGIO VERTEBRAL SEL VERTEBRAL UNI L MOD SED: IMG5367

## 2021-03-10 MED ORDER — FENTANYL CITRATE (PF) 100 MCG/2ML IJ SOLN
INTRAMUSCULAR | Status: AC | PRN
  Administered 2021-03-10: 25 ug via INTRAVENOUS

## 2021-03-10 MED ORDER — FENTANYL CITRATE (PF) 100 MCG/2ML IJ SOLN
INTRAMUSCULAR | Status: AC
  Filled 2021-03-10: qty 2

## 2021-03-10 MED ORDER — HEPARIN SODIUM (PORCINE) 1000 UNIT/ML IJ SOLN
INTRAMUSCULAR | Status: AC
  Filled 2021-03-10: qty 1

## 2021-03-10 MED ORDER — HEPARIN SODIUM (PORCINE) 1000 UNIT/ML IJ SOLN
INTRAMUSCULAR | Status: AC | PRN
  Administered 2021-03-10: 2000 [IU] via INTRAVENOUS

## 2021-03-10 MED ORDER — LIDOCAINE HCL (PF) 1 % IJ SOLN
INTRAMUSCULAR | Status: AC
  Filled 2021-03-10: qty 30

## 2021-03-10 MED ORDER — MIDAZOLAM HCL 2 MG/2ML IJ SOLN
INTRAMUSCULAR | Status: AC
  Filled 2021-03-10: qty 2

## 2021-03-10 MED ORDER — DEXAMETHASONE SODIUM PHOSPHATE 4 MG/ML IJ SOLN
4.0000 mg | Freq: Four times a day (QID) | INTRAMUSCULAR | Status: AC
  Administered 2021-03-10 (×3): 4 mg via INTRAVENOUS
  Filled 2021-03-10 (×3): qty 1

## 2021-03-10 MED ORDER — IOHEXOL 300 MG/ML  SOLN: 100.0000 mL | Freq: Once | INTRAMUSCULAR | Status: DC | PRN

## 2021-03-10 MED ORDER — MIDAZOLAM HCL 2 MG/2ML IJ SOLN
INTRAMUSCULAR | Status: AC | PRN
  Administered 2021-03-10: 1 mg via INTRAVENOUS

## 2021-03-10 MED ORDER — LIDOCAINE HCL 1 % IJ SOLN
INTRAMUSCULAR | Status: AC | PRN
  Administered 2021-03-10: 10 mL

## 2021-03-10 NOTE — Progress Notes (Signed)
TCD study completed.   Please see CV Proc for preliminary results.   Jeimy Bickert, RDMS, RVT  

## 2021-03-10 NOTE — Progress Notes (Signed)
Neurosurgery Service Progress Note  Subjective: No acute events overnight, some mild nausea and headaches, but not severe, no emesis   Objective: Vitals:   03/10/21 0100 03/10/21 0200 03/10/21 0400 03/10/21 0600  BP: (!) 143/86 136/79 (!) 141/92 137/81  Pulse: 60 72 63 61  Resp: 18 18 18 12   Temp:   98.6 F (37 C)   TempSrc:   Oral   SpO2:  96% 95% 95%  Weight:      Height:        Physical Exam: AOx3, PERRL, EOMI, FS, TM, Strength 5/5 x4, SILTx4, no drift  Assessment & Plan: 40 y.o. 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 5/25 rpt CTH w/ stable mild ventriculomegaly   -catheter angiogram today to determine Tx plan, can resume regular diet after angio -will give 3 doses of dex to help with meningismus Sx -TCDs today, ordered yesterday and not performed -SBP<160 -SCDs/TEDs, [facility]  6/25 Harold Bright  03/10/21 7:10 AM

## 2021-03-10 NOTE — Progress Notes (Signed)
STROKE TEAM PROGRESS NOTE   INTERVAL HISTORY Still complains of mild headache and some nausea but no vomiting.  He has been started on steroids for his headache and neurosurgery.  Hopefully he gets diagnosed with cerebral catheter angiogram today to plan treatment for his AVM.  Vital signs are stable.  Blood pressure adequately controlled.  Neurological exam is unchanged.  Transcranial Doppler has been ordered but not done yet Vitals:   03/10/21 1305 03/10/21 1315 03/10/21 1320 03/10/21 1325  BP: (!) 157/90 (!) 152/87 (!) 154/88 (!) 154/87  Pulse: 65 67 64 66  Resp: 13 14 12 13   Temp:      TempSrc:      SpO2: 95% 98% 98% 98%  Weight:      Height:       CBC:  Recent Labs  Lab 03/05/21 0237 03/05/21 0255  WBC 9.2  --   NEUTROABS 6.6  --   HGB 13.6 16.3  HCT 41.0 48.0  MCV 92.6  --   PLT 299  --    Basic Metabolic Panel:  Recent Labs  Lab 03/05/21 0237 03/05/21 0255  NA 135 138  K 3.9 3.5  CL 100 103  CO2 25  --   GLUCOSE 162* 120*  BUN 16 11  CREATININE 1.14 1.00  CALCIUM 9.0  --    Lipid Panel:  Recent Labs  Lab 03/05/21 0442  CHOL 146  TRIG 42  HDL 47  CHOLHDL 3.1  VLDL 8  LDLCALC 91   HgbA1c:  Recent Labs  Lab 03/05/21 0442  HGBA1C 5.7*   Urine Drug Screen:  Recent Labs  Lab 03/05/21 0304  LABOPIA NONE DETECTED  COCAINSCRNUR NONE DETECTED  LABBENZ NONE DETECTED  AMPHETMU NONE DETECTED  THCU NONE DETECTED  LABBARB NONE DETECTED    Alcohol Level  Recent Labs  Lab 03/05/21 0442  ETH <10    IMAGING past 24 hours No results found.     PHYSICAL EXAM Constitutional: Pleasant middle-age Caucasian male appears well-developed and well-nourished.  Psych: Affect appropriate to situation,calm and cooperative HENT: No oropharyngeal obstruction.  MSK: no joint deformities.  Cardiovascular: Normal rate and regular rhythm.  Respiratory: Effort normal, non-labored breathing Skin: Warm dry and intact visible skin  Neuro: Mental  Status: Patient is awake, alert, oriented to person, place, month, year, and situation. Patient is able to give a clear and coherent history. No signs of aphasia or neglect Cranial Nerves: II: Visual Fields are full. Pupils are equal, round, and reactive to light4 to 2 mm.  III,IV, VI: EOMI without ptosis or diploplia.  V: Facial sensation is symmetric to temperature VII: Facial movement is symmetric.  VIII: hearing is intact to voice X: Uvula elevates symmetrically XI: Shoulder shrug is symmetric. XII: tongue is midline without atrophy or fasciculations.  Motor: Tone is normal. Bulk is normal. 5/5 strength was present in all four extremities.  Sensory: Sensation is symmetric to light touch and temperature in the arms and legs. Cerebellar: FNF and HKS are intact bilaterally  ASSESSMENT/PLAN Harold Makin Lobergeris a 39 y.o.malewith a past medical history significant for hypertension, ocular migraines, hyperlipidemia, seasonal allergies, not on anticoagulant or antiplatelet medications presenting with sudden onset worst headache of his lifeand visual disturbance consistent with alexia without agraphia.   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 Slight prominence of theventricles unchanged from CT earlier today.   2D Echo EF 65%, The interatrial septum was not well visualized. Mild  thickening of  the mitral valve leaflet(s). Trivial mitral valve regurgitation. No thrombus. No wall motion abnormality.  LDL 91  HgbA1c 5.7  VTE prophylaxis - per primary team         Diet   Diet Carb Modified Fluid consistency: Thin; Room service appropriate? 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  Neurosurgery managing and formulating plan of care   On [place]   Hypertension  Stable  BP goal less than 160  Long-term BP goal normotensive  Hyperlipidemia  Home meds: resumed in hospital  LDL 91, not at goal < 70  High intensity statin: consider at discharge  Continue statin at discharge  Other Stroke Risk Factors  Current ETOH use, alcohol level <10, advised to drink no more than 2 drink(s) a day   Obesity, Body mass index is 43.68 kg/m., BMI >/= 30 associated with increased stroke risk, recommend weight loss, diet and exercise as appropriate   Hx of ocular migraines   High risk for Obstructive sleep apnea  Other Active Problems    Continue strict blood pressure control.  Diagnostic angiogram hopefully today with treatment decision about possible endovascular treatment versus surgery or radiation treatment to be made after that.  Check transcranial Doppler study results.  Short course of steroids for his headaches as per neurosurgery.  I had a long discussion patient and wife  at the bedside and answered questions.greater than 50% time during the 25-minute visit was spent on counseling and coordination of care and discussion with patient wife and care team and answering questions.      Harold Heady, MD Medical Director [facility] Pager: [phone] 03/10/2021 1:29 PM

## 2021-03-10 NOTE — Sedation Documentation (Signed)
Pt transported to 4N20 via bed accompanied by RNs. Aggie Cosier RN at bedside to receive pt. Handoff completed. Right groin remains level 0, bilateral lower distal pulses present via doppler. Pt awake alert and oriented. VSS. No s/s of distress at this time.

## 2021-03-11 MED ORDER — ONDANSETRON HCL 4 MG PO TABS: 4.0000 mg | ORAL_TABLET | Freq: Every day | ORAL | 1 refills | Status: DC | PRN

## 2021-03-11 MED ORDER — PANTOPRAZOLE SODIUM 40 MG PO TBEC: 40.0000 mg | DELAYED_RELEASE_TABLET | Freq: Every day | ORAL | Status: DC

## 2021-03-11 NOTE — Progress Notes (Signed)
Discharge instructions and medications reviewed with patient and wife at bedside. Answered all questions and accounted for all belongings. Patient discharged via wheelchair to person car with wife

## 2021-03-11 NOTE — Progress Notes (Signed)
STROKE TEAM PROGRESS NOTE   INTERVAL HISTORY Patient had diagnostic cerebral catheter angiogram yesterday by Dr. 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  Plan is to electively treat this with radiosurgery.  Patient states his headache and dizziness is improving and is tolerable.  He is keen to go home.  Vital signs are stable.  Blood pressure adequately controlled.  Neurological exam is unchanged.  Transcranial Doppler study shows normal mean flow velocities without evidence of vasospasm. Vitals:   03/11/21 0700 03/11/21 0800 03/11/21 0900 03/11/21 1038  BP: 140/83 140/90 136/85 (!) 156/94  Pulse: 63 65 (!) 58 84  Resp: 12 12 12 15   Temp:  (!) 97.5 F (36.4 C)    TempSrc:  Oral    SpO2: 96% 96% 95% 96%  Weight:      Height:       CBC:  Recent Labs  Lab 03/05/21 0237 03/05/21 0255  WBC 9.2  --   NEUTROABS 6.6  --   HGB 13.6 16.3  HCT 41.0 48.0  MCV 92.6  --   PLT 299  --    Basic Metabolic Panel:  Recent Labs  Lab 03/05/21 0237 03/05/21 0255  NA 135 138  K 3.9 3.5  CL 100 103  CO2 25  --   GLUCOSE 162* 120*  BUN 16 11  CREATININE 1.14 1.00  CALCIUM 9.0  --    Lipid Panel:  Recent Labs  Lab 03/05/21 0442  CHOL 146  TRIG 42  HDL 47  CHOLHDL 3.1  VLDL 8  LDLCALC 91   HgbA1c:  Recent Labs  Lab 03/05/21 0442  HGBA1C 5.7*   Urine Drug Screen:  Recent Labs  Lab 03/05/21 0304  LABOPIA NONE DETECTED  COCAINSCRNUR NONE DETECTED  LABBENZ NONE DETECTED  AMPHETMU NONE DETECTED  THCU NONE DETECTED  LABBARB NONE DETECTED    Alcohol Level  Recent Labs  Lab 03/05/21 0442  ETH <10    IMAGING past 24 hours VAS 03/07/21 TRANSCRANIAL DOPPLER  Result Date: 03/10/2021  Transcranial Doppler Patient Name:  Harold Bright [facility]  Date of Exam:   03/10/2021 Medical Rec #: 03/12/2021                Accession #:    [id number] Date  of Birth: 06/29/1981                 Patient Gender: M Patient Age:   40Y Exam Location:  [facility] Procedure:      VAS [facility] TRANSCRANIAL DOPPLER Referring Phys: [place] [id number] --------------------------------------------------------------------------------  Indications: Stroke. Comparison Study: No prior studies. Performing Technologist: Jadene Pierini RDMS,RVT  Examination Guidelines: A complete evaluation includes B-mode imaging, spectral Doppler, color Doppler, and power Doppler as needed of all accessible portions of each vessel. Bilateral testing is considered an integral part of a complete examination. Limited examinations for reoccurring indications may be performed as noted.  +----------+-------------+----------+-----------+-------+ RIGHT TCD Right VM (cm)Depth (cm)PulsatilityComment +----------+-------------+----------+-----------+-------+ MCA           65.00                 1.18            +----------+-------------+----------+-----------+-------+ ACA          -29.00  0.90            +----------+-------------+----------+-----------+-------+ Term ICA      50.00                 1.17            +----------+-------------+----------+-----------+-------+ PCA           30.00                 0.64            +----------+-------------+----------+-----------+-------+ Opthalmic     36.00                 1.66            +----------+-------------+----------+-----------+-------+ ICA siphon    32.00                 1.55            +----------+-------------+----------+-----------+-------+ Vertebral    -19.00                 0.73            +----------+-------------+----------+-----------+-------+ Distal ICA    16.00                 0.73            +----------+-------------+----------+-----------+-------+  +----------+------------+----------+-----------+-------+ LEFT TCD  Left VM (cm)Depth (cm)PulsatilityComment  +----------+------------+----------+-----------+-------+ MCA          63.00                 1.00            +----------+------------+----------+-----------+-------+ ACA          -22.00                0.92            +----------+------------+----------+-----------+-------+ Term ICA     64.00                 1.10            +----------+------------+----------+-----------+-------+ PCA          33.00                 0.76            +----------+------------+----------+-----------+-------+ Opthalmic    26.00                 1.22            +----------+------------+----------+-----------+-------+ ICA siphon   31.00                 1.84            +----------+------------+----------+-----------+-------+ Vertebral    -30.00                0.76            +----------+------------+----------+-----------+-------+ Distal ICA   19.00                 0.77            +----------+------------+----------+-----------+-------+  +------------+-------+-------+             VM cm/sComment +------------+-------+-------+ Prox Basilar-44.00         +------------+-------+-------+ Dist Basilar-45.00         +------------+-------+-------+ +----------------------+----+ Right Lindegaard Ratio4.06 +----------------------+----+ +---------------------+----+ Left Lindegaard Ratio3.32 +---------------------+----+    Preliminary    IR ANGIO INTRA EXTRACRAN SEL INTERNAL CAROTID BILAT MOD SED  Result Date:  03/10/2021 PROCEDURE: DIAGNOSTIC CEREBRAL ANGIOGRAM HISTORY: The patient is a 40 year old man initially presenting to the hospital several days ago with sudden onset of severe headache and difficulty reading. Is initial workup included CT scan of the brain demonstrating primarily intraventricular hemorrhage. Further workup included MRI and CT angiogram demonstrating the likelihood of an arteriovenous malformation involving the splenium of the corpus callosum. Patient has done very  well neurologically and presents today for further workup with diagnostic cerebral angiogram. ACCESS: The technical aspects of the procedure as well as its potential risks and benefits were reviewed with the patient. These risks included but were not limited bleeding, infection, allergic reaction, damage to organs or vital structures, stroke, non-diagnostic procedure, and the catastrophic outcomes of heart attack, coma, and death. With an understanding of these risks, informed consent was obtained and witnessed. The patient was placed in the supine position on the angiography table and the skin of right groin prepped in the usual sterile fashion. The procedure was performed under local anesthesia (1%-solution of bicarbonate-buffered Lidocaine) and conscious sedation with 1mg  versed and fentanyl monitored by myself and the in-suite nurse using continuous pulse-oximetry, heart rate, and non-invasive blood-pressure. A 5- French sheath was introduced in the right common femoral artery using Seldinger technique. A fluoro-phase sequence was used to document the sheath position. MEDICATIONS: HEPARIN: 2000 Units total. CONTRAST:  cc, Omnipaque 300 FLUOROSCOPY TIME:  FLUOROSCOPY TIME: See IR records TECHNIQUE: CATHETERS AND WIRES 5-French JB-1 catheter 0.035" glidewire VESSELS CATHETERIZED Right internal carotid Left internal carotid Right common femoral VESSELS STUDIED Right internal carotid, head Left internal carotid, head Left vertebral Right femoral PROCEDURAL NARRATIVE A 5-Fr JB-1 catheter was advanced over a 0.035 glidewire into the aortic arch. The above vessels were then sequentially catheterized and cervical / cerebral angiograms taken. After review of images, the catheter was removed without incident. FINDINGS: Right internal carotid, head: Injection reveals the presence of a widely patent ICA, M1, and A1 segments and their branches. No aneurysms are identified. There is flash filling of the AVM nidus  in the region of the splenium with early opacification of the straight sinus. This appears to be from flow through the posterior communicating artery and posterior cerebral arteries. The parenchymal and venous phases are normal. The venous sinuses are widely patent. Left internal carotid, head: Injection reveals the presence of a widely patent ICA, A1, and M1 segments and their branches. There is a large, hypertrophic anterior cerebral artery supplying the superior portion of the arteriovenous malformation. There is early opacification of the vein of Galen and straight sinus. Venous drainage appears to be through primarily a large inferiorly and posteriorly coursing vein around the AVM nidus. No inflow aneurysms are identified. No intra Nidal aneurysms are seen. I do not see any outflow venous varices. Overall dimension of the arteriovenous malformation appears to be approximately 3 cm craniocaudal by 2.3 cm medial-lateral by 3 cm antero posterior. The parenchymal and venous phases are normal. The venous sinuses are widely patent. Left vertebral: Injection reveals the presence of a widely patent vertebral artery. This leads to a widely patent basilar artery that terminates in bilateral P1. The basilar apex is normal. Again demonstrated is the arteriovenous malformation at the level of the splenium of the corpus callosum. This appears to be fed primarily through the left posterior cerebral artery. I do not see convincing evidence of right-sided PCA supply. No inflow aneurysms are seen. No intra Nidal aneurysms are identified. Again seen is early opacification of the vein of  Galen and straight sinus. The parenchymal and venous phases are normal. The venous sinuses are widely patent. Right femoral: Normal vessel. No significant atherosclerotic disease. Arterial sheath in adequate position. DISPOSITION: Upon completion of the study, the femoral sheath was removed and hemostasis obtained using a 5-Fr ExoSeal closure  device. Good proximal and distal lower extremity pulses were documented upon achievement of hemostasis. The procedure was well tolerated and no early complications were observed. The patient was transferred to the holding area to lay flat for 2 hours. IMPRESSION: 1. 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 The preliminary results of this procedure were shared with the patient and the patient's family. Electronically Signed   By: Lisbeth Renshaw   On: 03/10/2021 13:53   IR ANGIO VERTEBRAL SEL VERTEBRAL UNI L MOD SED  Result Date: 03/10/2021 PROCEDURE: DIAGNOSTIC CEREBRAL ANGIOGRAM HISTORY: The patient is a 40 year old man initially presenting to the hospital several days ago with sudden onset of severe headache and difficulty reading. Is initial workup included CT scan of the brain demonstrating primarily intraventricular hemorrhage. Further workup included MRI and CT angiogram demonstrating the likelihood of an arteriovenous malformation involving the splenium of the corpus callosum. Patient has done very well neurologically and presents today for further workup with diagnostic cerebral angiogram. ACCESS: The technical aspects of the procedure as well as its potential risks and benefits were reviewed with the patient. These risks included but were not limited bleeding, infection, allergic reaction, damage to organs or vital structures, stroke, non-diagnostic procedure, and the catastrophic outcomes of heart attack, coma, and death. With an understanding of these risks, informed consent was obtained and witnessed. The patient was placed in the supine position on the angiography table and the skin of right groin prepped in the usual sterile fashion. The procedure was performed under local anesthesia (1%-solution of bicarbonate-buffered Lidocaine) and conscious sedation  with 1mg  versed and fentanyl monitored by myself and the in-suite nurse using continuous pulse-oximetry, heart rate, and non-invasive blood-pressure. A 5- French sheath was introduced in the right common femoral artery using Seldinger technique. A fluoro-phase sequence was used to document the sheath position. MEDICATIONS: HEPARIN: 2000 Units total. CONTRAST:  cc, Omnipaque 300 FLUOROSCOPY TIME:  FLUOROSCOPY TIME: See IR records TECHNIQUE: CATHETERS AND WIRES 5-French JB-1 catheter 0.035" glidewire VESSELS CATHETERIZED Right internal carotid Left internal carotid Right common femoral VESSELS STUDIED Right internal carotid, head Left internal carotid, head Left vertebral Right femoral PROCEDURAL NARRATIVE A 5-Fr JB-1 catheter was advanced over a 0.035 glidewire into the aortic arch. The above vessels were then sequentially catheterized and cervical / cerebral angiograms taken. After review of images, the catheter was removed without incident. FINDINGS: Right internal carotid, head: Injection reveals the presence of a widely patent ICA, M1, and A1 segments and their branches. No aneurysms are identified. There is flash filling of the AVM nidus in the region of the splenium with early opacification of the straight sinus. This appears to be from flow through the posterior communicating artery and posterior cerebral arteries. The parenchymal and venous phases are normal. The venous sinuses are widely patent. Left internal carotid, head: Injection reveals the presence of a widely patent ICA, A1, and M1 segments and their branches. There is a large, hypertrophic anterior cerebral artery supplying the superior portion of the arteriovenous malformation. There is early opacification of the vein of Galen and straight sinus. Venous drainage  appears to be through primarily a large inferiorly and posteriorly coursing vein around the AVM nidus. No inflow aneurysms are identified. No intra Nidal aneurysms are seen. I  do not see any outflow venous varices. Overall dimension of the arteriovenous malformation appears to be approximately 3 cm craniocaudal by 2.3 cm medial-lateral by 3 cm antero posterior. The parenchymal and venous phases are normal. The venous sinuses are widely patent. Left vertebral: Injection reveals the presence of a widely patent vertebral artery. This leads to a widely patent basilar artery that terminates in bilateral P1. The basilar apex is normal. Again demonstrated is the arteriovenous malformation at the level of the splenium of the corpus callosum. This appears to be fed primarily through the left posterior cerebral artery. I do not see convincing evidence of right-sided PCA supply. No inflow aneurysms are seen. No intra Nidal aneurysms are identified. Again seen is early opacification of the vein of Galen and straight sinus. The parenchymal and venous phases are normal. The venous sinuses are widely patent. Right femoral: Normal vessel. No significant atherosclerotic disease. Arterial sheath in adequate position. DISPOSITION: Upon completion of the study, the femoral sheath was removed and hemostasis obtained using a 5-Fr ExoSeal closure device. Good proximal and distal lower extremity pulses were documented upon achievement of hemostasis. The procedure was well tolerated and no early complications were observed. The patient was transferred to the holding area to lay flat for 2 hours. IMPRESSION: 1. 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 The preliminary results of this procedure were shared with the patient and the patient's family. Electronically Signed   By: Lisbeth Renshaw   On: 03/10/2021 13:53       PHYSICAL EXAM Constitutional: Pleasant middle-age Caucasian male appears well-developed and well-nourished.  Psych: Affect appropriate to  situation,calm and cooperative HENT: No oropharyngeal obstruction.  MSK: no joint deformities.  Cardiovascular: Normal rate and regular rhythm.  Respiratory: Effort normal, non-labored breathing Skin: Warm dry and intact visible skin  Neuro: Mental Status: Patient is awake, alert, oriented to person, place, month, year, and situation. Patient is able to give a clear and coherent history. No signs of aphasia or neglect Cranial Nerves: II: Visual Fields are full. Pupils are equal, round, and reactive to light4 to 2 mm.  III,IV, VI: EOMI without ptosis or diploplia.  V: Facial sensation is symmetric to temperature VII: Facial movement is symmetric.  VIII: hearing is intact to voice X: Uvula elevates symmetrically XI: Shoulder shrug is symmetric. XII: tongue is midline without atrophy or fasciculations.  Motor: Tone is normal. Bulk is normal. 5/5 strength was present in all four extremities.  Sensory: Sensation is symmetric to light touch and temperature in the arms and legs. Cerebellar: FNF and HKS are intact bilaterally  ASSESSMENT/PLAN Harold Bernabei Lobergeris a 39 y.o.malewith a past medical history significant for hypertension, ocular migraines, hyperlipidemia, seasonal allergies, not on anticoagulant or antiplatelet medications presenting with sudden onset worst headache of his lifeand visual disturbance consistent with alexia without agraphia.   Intraventricular hemorrhage likely secondary to AV malformation   Code Stroke CT head with intraventricular hemorrhage, left greater than right ventricle extending into the third and fourth ventricles with slight ventricular prominence without clear hydrocephalus   CTA head & neck CTA with an AV malformation at the splenium of the corpus callosum with dilated draining vein connecting to the vein of Galen and arterial  supply from the distal anterior cerebral artery. Location is suspicious for contribution to the  IVH   MRI: AVM in the splenium of the corpus callosum primarily on the left side with intraventricular hemorrhage. Slight prominence of theventricles unchanged from CT earlier today.   2D Echo EF 65%, The interatrial septum was not well visualized. Mild thickening of  the mitral valve leaflet(s). Trivial mitral valve regurgitation. No thrombus. No wall motion abnormality.  LDL 91  HgbA1c 5.7  VTE prophylaxis - per primary team         Diet   Diet Carb Modified Fluid consistency: Thin; Room service appropriate? 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  Neurosurgery managing and formulating plan of care   On [place]   Hypertension  Stable  BP goal less than 160  Long-term BP goal normotensive  Hyperlipidemia  Home meds: resumed in hospital  LDL 91, not at goal < 70  High intensity statin: consider at discharge  Continue statin at discharge  Other Stroke Risk Factors  Current ETOH use, alcohol level <10, advised to drink no more than 2 drink(s) a day   Obesity, Body mass index is 43.68 kg/m., BMI >/= 30 associated with increased stroke risk, recommend weight loss, diet and exercise as appropriate   Hx of ocular migraines   High risk for Obstructive sleep apnea  Other Active Problems    Recommend elective AVM treatment as per Dr. Conchita Paris with radiosurgery.  Maintain strict blood pressure control and patient may be discharged home today.  He may follow-up electively with me in the stroke clinic in 8 weeks.  Discussed with patient and wife and mother and answered questions.  Discussed with neurosurgery physician assistant.  I had a long discussion patient and wife  at the bedside and answered questions.greater than 50% time during  the 25-minute visit was spent on counseling and coordination of care and discussion with patient wife and care team and answering questions.      Delia Heady, MD Medical Director [facility] Pager: [phone] 03/11/2021 12:08 PM

## 2021-03-11 NOTE — Progress Notes (Signed)
Neurosurgery Service Progress Note  Subjective: No acute events overnight, some mild nausea and headaches but not bothersome, taking po w/o issue  Objective: Vitals:   03/11/21 0700 03/11/21 0800 03/11/21 0900 03/11/21 1038  BP: 140/83 140/90 136/85 (!) 156/94  Pulse: 63 65 (!) 58 84  Resp: 12 12 12 15   Temp:  (!) 97.5 F (36.4 C)    TempSrc:  Oral    SpO2: 96% 96% 95% 96%  Weight:      Height:        Physical Exam: AOx3, PERRL, EOMI, FS, TM, Strength 5/5 x4, SILTx4, no drift  Assessment & Plan: 40 y.o. 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 5/25 rpt CTH w/ stable mild ventriculomegaly, angio w/ no high risk features - ACA/PCA feeders, TCDs WNL  -discharge home today  6/25  03/11/21 11:50 AM

## 2021-03-11 NOTE — Discharge Instructions (Signed)
Discharge Instructions  Slowly increase your activity back to normal, avoid heavy exertion like weight lifting for 2 weeks.  Check your blood pressure daily, you can increase your olmesartan from 20mg  to 40mg  if your SBP is over 140. When increasing the dose, be mindful when you stand up from sitting or laying down, as this can make you orthostatic or dizzy.   Follow up with Dr. in 4 weeks after discharge. If you do not already have a discharge appointment, please call his office at [phone] to schedule a follow up appointment. If you have any concerns or questions, please call the office and let Maurice Small know.

## 2021-03-11 NOTE — Discharge Summary (Signed)
Discharge Summary  Date of Admission: 03/05/2021  Date of Discharge: 03/11/21  Attending Physician: Autumn Patty, MD  Hospital Course: Patient presented to the ED with [facility], [facility] showed isolated IVH, CTA and then MRI showed left splenial AVM. His headache improved, he had some nausea and repeat CTH showed some mild persistent ventriculomegaly, no intervention was required and the patient's symptoms resolved. He was monitored in the ICU without issue. TCDs were WNL. A catheter angiogram confirmed the diagnosis of a left splenial AVM, Spetzler-Martin grade 3. We discussed treatment options with him and recommended radiosurgery as an outpatient. His hospital course was uncomplicated and the patient was discharged home on 03/11/2021. He will follow up in clinic with me in 4 weeks, but will call sooner with any concerns or quesitons.  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

## 2021-03-11 NOTE — Plan of Care (Signed)
Patient alert and oriented, performing ADLs independently and participated in dicussion with Dr. Maurice Small about future plans for treatment and self-care at home.

## 2021-03-15 ENCOUNTER — Other Ambulatory Visit: Payer: Self-pay | Admitting: *Deleted

## 2021-03-15 NOTE — Patient Outreach (Signed)
[facility]) Care Management  03/15/2021  Harold Bright 07/28/1981 [id number]   RED ON EMMI ALERT - Stoke Day # 1 Date: 5/29 Red Alert Reason: Not scheduled follow up appointment   Outreach attempt #1, unsuccessful, unable to leave voice message.  Called failed to go through x3 attempts, phone didn't ring, call ended.   Plan: RN CM will send outreach letter and follow up within the next 3-4 business days.  Kemper Durie, [place], MSN [facility] Care Management  [facility] Manager [phone]

## 2021-03-18 ENCOUNTER — Other Ambulatory Visit: Payer: Self-pay | Admitting: *Deleted

## 2021-03-18 NOTE — Patient Outreach (Signed)
[facility]) Care Management  03/18/2021  Harold Bright 1980-11-19 [id number]   RED ON EMMI ALERT - Stoke Day # 1 Date: 5/29 Red Alert Reason: Not scheduled follow up appointment   Outreach attempt #2, successful.  Identity verified.  This care manager introduced self and stated purpose of call.  [facility] care management services explained.    Member reports living with wife.  State initially after discharge he had rough days but is now recovering, denies complaints.  He does have follow up appointment with PCP on 6/8 and with neurosurgeon on 6/23.  Does not have appointment with neurologist, will wait until after seen by surgeon to determine if appointment is needed.  Report he is taking all medications as instructed, denies questions or need for financial assistance.    Plan: RN CM will follow up with member within the next 3 weeks.    Kemper Durie, [place], MSN [facility] Care Management  [facility] Manager [phone]

## 2021-03-19 NOTE — Patient Instructions (Signed)
Stroke Prevention Some medical conditions and lifestyle choices can lead to a higher risk for a stroke. You can help to prevent a stroke by eating healthy foods and exercising. It also helps to not smoke and to manage any health problems you may have. How can this condition affect me? A stroke is an emergency. It should be treated right away. A stroke can lead to brain damage or threaten your life. There is a better chance of surviving and getting better after a stroke if you get medical help right away. What can increase my risk? The following medical conditions may increase your risk of a stroke:  Diseases of the heart and blood vessels (cardiovascular disease).  High blood pressure (hypertension).  Diabetes.  High cholesterol.  Sickle cell disease.  Problems with blood clotting.  Being very overweight.  Sleeping problems (obstructivesleep apnea). Other risk factors include:  Being older than age 60.  A history of blood clots, stroke, or mini-stroke (TIA).  Race, ethnic background, or a family history of stroke.  Smoking or using tobacco products.  Taking birth control pills, especially if you smoke.  Heavy alcohol and drug use.  Not being active. What actions can I take to prevent this? Manage your health conditions  High cholesterol. ? Eat a healthy diet. If this is not enough to manage your cholesterol, you may need to take medicines. ? Take medicines as told by your doctor.  High blood pressure. ? Try to keep your blood pressure below 130/80. ? If your blood pressure cannot be managed through a healthy diet and regular exercise, you may need to take medicines. ? Take medicines as told by your doctor. ? Ask your doctor if you should check your blood pressure at home. ? Have your blood pressure checked every year.  Diabetes. ? Eat a healthy diet and get regular exercise. If your blood sugar (glucose) cannot be managed through diet and exercise, you may need to  take medicines. ? Take medicines as told by your doctor.  Talk to your doctor about getting checked for sleeping problems. Signs of a problem can include: ? Snoring a lot. ? Feeling very tired.  Make sure that you manage any other conditions you have. Nutrition  Follow instructions from your doctor about what to eat or drink. You may be told to: ? Eat and drink fewer calories each day. ? Limit how much salt (sodium) you use to 1,500 milligrams (mg) each day. ? Use only healthy fats for cooking, such as olive oil, canola oil, and sunflower oil. ? Eat healthy foods. To do this:  Choose foods that are high in fiber. These include whole grains, and fresh fruits and vegetables.  Eat at least 5 servings of fruits and vegetables a day. Try to fill one-half of your plate with fruits and vegetables at each meal.  Choose low-fat (lean) proteins. These include low-fat cuts of meat, chicken without skin, fish, tofu, beans, and nuts.  Eat low-fat dairy products. ? Avoid foods that:  Are high in salt.  Have saturated fat.  Have trans fat.  Have cholesterol.  Are processed or pre-made. ? Count how many carbohydrates you eat and drink each day.   Lifestyle  If you drink alcohol: ? Limit how much you have to:  0-1 drink a day for women who are not pregnant.  0-2 drinks a day for men. ? Know how much alcohol is in your drink. In the [place] one drink equals one 12 oz bottle   of beer (355mL), one 5 oz glass of wine (148mL), or one 1 oz glass of hard liquor (44mL).  Do not smoke or use any products that have nicotine or tobacco. If you need help quitting, ask your doctor.  Avoid secondhand smoke.  Do not use drugs. Activity  Try to stay at a healthy weight.  Get at least 30 minutes of exercise on most days, such as: ? Fast walking. ? Biking. ? Swimming.   Medicines  Take over-the-counter and prescription medicines only as told by your doctor.  Avoid taking birth control pills.  Talk to your doctor about the risks of taking birth control pills if: ? You are over 35 years old. ? You smoke. ? You get very bad headaches. ? You have had a blood clot. Where to find more information  American Stroke Association: www.strokeassociation.org Get help right away if:  You or a loved one has any signs of a stroke. "BE FAST" is an easy way to remember the warning signs: ? B - Balance. Dizziness, sudden trouble walking, or loss of balance. ? E - Eyes. Trouble seeing or a change in how you see. ? F - Face. Sudden weakness or loss of feeling of the face. The face or eyelid may droop on one side. ? A - Arms. Weakness or loss of feeling in an arm. This happens all of a sudden and most often on one side of the body. ? S - Speech. Sudden trouble speaking, slurred speech, or trouble understanding what people say. ? T - Time. Time to call emergency services. Write down what time symptoms started.  You or a loved one has other signs of a stroke, such as: ? A sudden, very bad headache with no known cause. ? Feeling like you may vomit (nausea). ? Vomiting. ? A seizure. These symptoms may be an emergency. Get help right away. Call your local emergency services (911 in the [place]  Do not wait to see if the symptoms will go away.  Do not drive yourself to the hospital. Summary  You can help to prevent a stroke by eating healthy, exercising, and not smoking. It also helps to manage any health problems you have.  Do not smoke or use any products that contain nicotine or tobacco.  Get help right away if you or a loved one has any signs of a stroke. This information is not intended to replace advice given to you by your health care provider. Make sure you discuss any questions you have with your health care provider. Document Revised: 05/03/2020 Document Reviewed: 05/03/2020 Elsevier Patient Education  2021 Elsevier Inc.  

## 2021-03-24 ENCOUNTER — Telehealth: Payer: Self-pay | Admitting: Internal Medicine

## 2021-03-24 NOTE — Telephone Encounter (Signed)
    Harold Bright with [facility] would like to confirm echo result. She said Dr. Tracie Harrier would like to confirm that pt has abnormal mitral valve. She said to call her number  [phone], she said she will be at her office until 3:30 pm today. If she didn't answer just to leave her a message

## 2021-03-24 NOTE — Telephone Encounter (Signed)
Spoke with Cicero Duck at [facility]. Gave report from echo results.  Erika verbalizes understanding and reads back results.

## 2021-03-28 ENCOUNTER — Other Ambulatory Visit: Payer: Self-pay | Admitting: *Deleted

## 2021-03-28 NOTE — Patient Outreach (Signed)
[facility]) Care Management  03/28/2021  Heru Montz Vivianne Master [birth year] [id number]   RED ON EMMI ALERT - Stroke Day # 13 Date: 6/10 Red Alert Reason: Not had follow up appointment and Not scheduled follow up appointment   Outreach attempt #1, successful.  Member report he has attended his follow up appointment with his PCP last week but still has appointments in the future for neurology, radiology and neurosurgeon (later this month and in September).  Denies need for transportation or any other needs.  Encouraged to contact this care manager with questions.    Plan: RN CM will close case at this time, no further needs identified.  Kemper Durie, [place], MSN [facility] Care Management  [facility] Manager [phone]

## 2021-03-31 ENCOUNTER — Institutional Professional Consult (permissible substitution): Payer: Managed Care, Other (non HMO) | Admitting: Radiation Oncology

## 2021-04-12 ENCOUNTER — Encounter: Payer: Self-pay | Admitting: Urology

## 2021-04-12 ENCOUNTER — Ambulatory Visit
Admission: RE | Admit: 2021-04-12 | Discharge: 2021-04-12 | Disposition: A | Discharge status: Home / Self Care | Payer: Managed Care, Other (non HMO) | Source: Ambulatory Visit | Attending: Radiation Oncology | Admitting: Radiation Oncology

## 2021-04-12 ENCOUNTER — Other Ambulatory Visit: Payer: Self-pay | Admitting: Radiation Therapy

## 2021-04-12 ENCOUNTER — Ambulatory Visit: Payer: Managed Care, Other (non HMO) | Admitting: *Deleted

## 2021-04-12 ENCOUNTER — Encounter: Payer: Self-pay | Admitting: Radiation Oncology

## 2021-04-12 ENCOUNTER — Other Ambulatory Visit: Payer: Self-pay

## 2021-04-12 VITALS — BP 127/81 | HR 82 | Temp 96.8°F | Resp 18 | Ht 72.0 in | Wt 310.0 lb

## 2021-04-12 DIAGNOSIS — Q282 Arteriovenous malformation of cerebral vessels: Secondary | ICD-10-CM | POA: Insufficient documentation

## 2021-04-12 DIAGNOSIS — I615 Nontraumatic intracerebral hemorrhage, intraventricular: Secondary | ICD-10-CM

## 2021-04-12 DIAGNOSIS — Q283 Other malformations of cerebral vessels: Secondary | ICD-10-CM

## 2021-04-12 DIAGNOSIS — Q2730 Arteriovenous malformation, site unspecified: Secondary | ICD-10-CM

## 2021-04-12 LAB — BUN & CREATININE (CHCC)
BUN: 12 mg/dL (ref 6–20)
Creatinine: 0.89 mg/dL (ref 0.61–1.24)
GFR, Estimated: >60 mL/min (ref >60)

## 2021-04-12 NOTE — Progress Notes (Signed)
Location/Histology of Brain Tumor:  Left splenial AVM, Spetzler-Martin grade 3  Patient presented with symptoms of:  headache that began 30 minutes prior to presents to ED on 03/05/2021. Reported fairly quick onset, frontal, with associated blurry vision bilaterally- difficulty reading the information on his phone. While in the ED triage area patient had an episode of emesis and was taken emergently back for head CT which revealed intraventricular hemorrhage. CT Head w/o Contrast 03/09/2021 IMPRESSION: --Improving intraventricular hemorrhage. No new hemorrhage. Mild ventricular enlargement, stable --AVM splenium of the corpus callosum  Past or anticipated interventions, if any, per neurosurgery:  03/10/2021 Dr. Lisbeth Renshaw DIAGNOSTIC CEREBRAL ANGIOGRAM --IR ANGIO INTRA EXTRACRAN SEL INTERNAL CAROTID BILAT MOD SED --IR ANGIO VERTEBRAL SEL VERTEBRAL UNI L MOD SED   Past or anticipated interventions, if any, per medical oncology:  No referral placed at this time  Dose of Decadron, if applicable: Not ordered currently  Recent neurologic symptoms, if any:  Seizures: None Headaches: Not recently Nausea: None Dizziness/ataxia: None Difficulty with hand coordination: None Focal numbness/weakness: None Visual deficits/changes: None Confusion/Memory deficits None  SAFETY ISSUES: Prior radiation? no Pacemaker/ICD? no Possible current pregnancy? N/A Is the patient on methotrexate? no  Additional Complaints / other details:  Vitals:   04/12/21 0841  BP: 127/81  Pulse: 82  Resp: 18  Temp: (!) 96.8 F (36 C)  TempSrc: Temporal  SpO2: 99%  Weight: (!) 140.6 kg  Height: 6' (1.829 m)

## 2021-04-12 NOTE — Progress Notes (Signed)
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    ICD-10-CM   1. IVH (intraventricular hemorrhage) (HCC)  I61.5       HISTORY OF PRESENT ILLNESS: Harold Bright is a 40 y.o. male seen at the request of Dr. Maurice Small. He initially presented to the ED at [facility] on 03/05/21 with a sudden-onset severe frontal headache with associated blurred vision bilaterally. He underwent head CT at that time revealing moderate volume acute intraventricular hemorrhage. This was further evaluated with a CT angio head/neck and brain MRI showing a 2.5 x 2.4 x 2.6 cm arteriovenous malformation in the splenium of the corpus callosum primarily on the left side with an unchanged appearance of the intraventricular hemorrhage.   His intraventricular hemorrhage improved spontaneously without intervention, as noted on head CT on 03/09/21 during his hospitalization for observation. 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  Dr. Maurice Small recommended stereotactic radiosurgery as an outpatient, and the patient was discharged on 03/11/21.  He has continued to do well since that time and presents today for further discussion regarding the role of stereotactic radiosurgery in the management of AVM.  PREVIOUS RADIATION THERAPY: No  PAST MEDICAL HISTORY:  Past Medical History:  Diagnosis Date   Hypertension       PAST SURGICAL HISTORY: Past Surgical History:   Procedure Laterality Date   IR ANGIO INTRA EXTRACRAN SEL INTERNAL CAROTID BILAT MOD SED  03/10/2021   IR ANGIO VERTEBRAL SEL VERTEBRAL UNI L MOD SED  03/10/2021    FAMILY HISTORY: No family history on file.  SOCIAL HISTORY:  Social History   Socioeconomic History   Marital status: Married    Spouse name: Not on file   Number of children: Not on file   Years of education: Not on file   Highest education level: Not on file  Occupational History   Not on file  Tobacco Use   Smoking status: Never   Smokeless tobacco: Never  Vaping Use   Vaping Use: Never used  Substance and Sexual Activity   Alcohol use: Yes    Comment: 2 a day   Drug use: Never   Sexual activity: Not on file  Other Topics Concern   Not on file  Social History Narrative   Not on file   Social Determinants of Health   Financial Resource Strain: Not on file  Food Insecurity: Not on file  Transportation Needs: Not on file  Physical Activity: Not on file  Stress: Not on file  Social Connections: Not on file  Intimate Partner Violence: Not on file    ALLERGIES: Penicillins and Sulfa antibiotics  MEDICATIONS:  Current Outpatient Medications  Medication Sig Dispense Refill   cetirizine (ZYRTEC) 10 MG tablet Take 10 mg by mouth every morning.     fluticasone (FLONASE) 50 MCG/ACT nasal spray Place 1 spray into both nostrils daily.     Multiple Vitamin (MULTIVITAMIN WITH MINERALS) TABS tablet Take 1 tablet by mouth every morning.     olmesartan (BENICAR)  20 MG tablet Take 20 mg by mouth every morning.     Omega-3 Fatty Acids (FISH OIL PO) Take 2 capsules by mouth every morning.     rosuvastatin (CRESTOR) 10 MG tablet Take 10 mg by mouth at bedtime.     ibuprofen (ADVIL) 200 MG tablet Take 400 mg by mouth daily as needed for headache (pain). (Patient not taking: Reported on 04/12/2021)     ondansetron (ZOFRAN) 4 MG tablet Take 1 tablet (4 mg total) by mouth daily as needed for nausea or vomiting. (Patient not  taking: Reported on 04/12/2021) 15 tablet 1   No current facility-administered medications for this encounter.    REVIEW OF SYSTEMS:  On review of systems, the patient reports that he is doing well overall. He denies any chest pain, shortness of breath, cough, fevers, chills, night sweats, unintended weight changes. He denies any bowel or bladder disturbances, and denies abdominal pain, nausea or vomiting. He denies any new musculoskeletal or joint aches or pains. He also specifically denies any neurologic symptoms, such as seizures, headache, nausea, dizziness, and visual changes. A complete review of systems is obtained and is otherwise negative.   PHYSICAL EXAM:  Wt Readings from Last 3 Encounters:  04/12/21 (!) 310 lb (140.6 kg)  03/05/21 (!) 322 lb 1.5 oz (146.1 kg)   Temp Readings from Last 3 Encounters:  04/12/21 (!) 96.8 F (36 C) (Temporal)  03/11/21 99.8 F (37.7 C) (Axillary)   BP Readings from Last 3 Encounters:  04/12/21 127/81  03/11/21 (!) 153/95   Pulse Readings from Last 3 Encounters:  04/12/21 82  03/11/21 76   Pain Assessment Pain Score: 0-No pain/10  In general this is a well appearing Caucasian male in no acute distress. He's alert and oriented x4 and appropriate throughout the examination. Cardiopulmonary assessment is negative for acute distress and he exhibits normal effort.     KPS = 100  100 - Normal; no complaints; no evidence of disease. 90   - Able to carry on normal activity; minor signs or symptoms of disease. 80   - Normal activity with effort; some signs or symptoms of disease. 68   - Cares for self; unable to carry on normal activity or to do active work. 60   - Requires occasional assistance, but is able to care for most of his personal needs. 50   - Requires considerable assistance and frequent medical care. 40   - Disabled; requires special care and assistance. 30   - Severely disabled; hospital admission is indicated although death not  imminent. 20   - Very sick; hospital admission necessary; active supportive treatment necessary. 10   - Moribund; fatal processes progressing rapidly. 0     - Dead  Karnofsky DA, Abelmann WH, Craver LS and Burchenal [facility] [phone]) The use of the nitrogen mustards in the palliative treatment of carcinoma: with particular reference to bronchogenic carcinoma Cancer 1 634-56  LABORATORY DATA:  Lab Results  Component Value Date   WBC 9.2 03/05/2021   HGB 16.3 03/05/2021   HCT 48.0 03/05/2021   MCV 92.6 03/05/2021   PLT 299 03/05/2021   Lab Results  Component Value Date   NA 138 03/05/2021   K 3.5 03/05/2021   CL 103 03/05/2021   CO2 25 03/05/2021   Lab Results  Component Value Date   ALT 36 03/05/2021   AST 28 03/05/2021   ALKPHOS 64 03/05/2021   BILITOT 0.8 03/05/2021     RADIOGRAPHY: No results  found.    IMPRESSION/PLAN: 1. 40 y.o. 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  Today, we talked to the patient and his wife about the findings and workup thus far. We discussed the natural history of arteriovenous malformation  (AVM) and general treatment, highlighting the role of stereotactic radiosurgery in the management. We discussed the available radiation techniques, and focused on the details and logistics of delivery.  The recommendation is for a single fraction of SRS to the left splenial AVM.  We reviewed the anticipated acute and late sequelae associated with radiation in this setting. The patient was encouraged to ask questions that were answered to his stated satisfaction.  At the end of our conversation, the patient is in agreement to proceed with 3T MRI for treatment planning in anticipation of proceeding with the recommended single fraction of SRS. He is tentatively scheduled for 3T MRI and CT simulation on 04/25/21 in anticipation of SRS treatment in collaboration with Dr. Maurice Small on 05/03/21.  He has freely signed written consent to proceed today  in the office and a copy of this document will be placed in his medical record.  We will share our discussion with Dr. Johnsie Cancel and proceed with treatment planning accordingly.  We personally spent 75 minutes in this encounter including chart review, reviewing radiological studies, meeting face-to-face with the patient, entering orders and completing documentation.   Marguarite Arbour, PA-C    Margaretmary Dys, MD  [facility]  Radiation Oncology Direct Dial: [phone]  Fax: [phone] [email or web address]  Skype  LinkedIn   This document serves as a record of services personally performed by Margaretmary Dys, MD and Marcello Fennel, PA-C. It was created on their behalf by Mickie Bail, a trained medical scribe. The creation of this record is based on the scribe's personal observations and the provider's statements to them. This document has been checked and approved by the attending provider.

## 2021-04-13 ENCOUNTER — Institutional Professional Consult (permissible substitution): Payer: Managed Care, Other (non HMO) | Admitting: Radiation Oncology

## 2021-04-20 ENCOUNTER — Other Ambulatory Visit: Payer: Managed Care, Other (non HMO)

## 2021-04-25 ENCOUNTER — Ambulatory Visit
Admission: RE | Admit: 2021-04-25 | Discharge: 2021-04-25 | Disposition: A | Discharge status: Home / Self Care | Payer: Managed Care, Other (non HMO) | Source: Ambulatory Visit | Attending: Radiation Oncology | Admitting: Radiation Oncology

## 2021-04-25 ENCOUNTER — Other Ambulatory Visit: Payer: Self-pay

## 2021-04-25 VITALS — BP 124/84 | HR 95 | Temp 97.8°F | Resp 20 | Ht 72.0 in | Wt 313.0 lb

## 2021-04-25 DIAGNOSIS — Z510 Encounter for antineoplastic radiation therapy: Secondary | ICD-10-CM | POA: Insufficient documentation

## 2021-04-25 DIAGNOSIS — Q282 Arteriovenous malformation of cerebral vessels: Secondary | ICD-10-CM | POA: Insufficient documentation

## 2021-04-25 DIAGNOSIS — Q283 Other malformations of cerebral vessels: Secondary | ICD-10-CM

## 2021-04-25 MED ORDER — SODIUM CHLORIDE 0.9% FLUSH
10.0000 mL | Freq: Once | INTRAVENOUS | Status: AC
  Administered 2021-04-25: 10 mL via INTRAVENOUS

## 2021-04-25 MED ORDER — GADOBENATE DIMEGLUMINE 529 MG/ML IV SOLN
20.0000 mL | Freq: Once | INTRAVENOUS | Status: AC | PRN
  Administered 2021-04-25: 20 mL via INTRAVENOUS

## 2021-04-25 NOTE — Progress Notes (Addendum)
Has armband been applied?  Yes.    Does patient have an allergy to IV contrast dye?: No.   Has patient ever received premedication for IV contrast dye?: No.   Does patient take metformin?: No.  Date of lab work: April 12, 2021 BUN: 12 CR: 0.89 eGFR: >60  IV site: antecubital right, condition patent and no redness  Has IV site been added to flowsheet?  Yes.    BP 124/84 (BP Location: Left Arm, Patient Position: Sitting, Cuff Size: Large)   Pulse 95   Temp 97.8 F (36.6 C)   Resp 20   Ht 6' (1.829 m)   Wt (!) 313 lb (142 kg)   SpO2 98%   BMI 42.45 kg/m   **IV will be removed by MRI staff after patient's scan** 

## 2021-04-25 NOTE — Progress Notes (Signed)
  Radiation Oncology         [phone]) [phone] ________________________________  Name: Harold Bright MRN: [id number]  Date: 04/25/2021  DOB: [birth year]  SIMULATION AND TREATMENT PLANNING NOTE    ICD-10-CM   1. 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  NARRATIVE:  The patient was brought to the CT Simulation planning suite.  Identity was confirmed.  All relevant records and images related to the planned course of therapy were reviewed.  The patient freely provided informed written consent to proceed with treatment after reviewing the details related to the planned course of therapy. The consent form was witnessed and verified by the simulation staff. Intravenous access was established for contrast administration. Then, the patient was set-up in a stable reproducible supine position for radiation therapy.  A relocatable thermoplastic stereotactic head frame was fabricated for precise immobilization.  CT images were obtained.  Surface markings were placed.  The CT images were loaded into the planning software and fused with the patient's targeting MRI scan.  Then the target and avoidance structures were contoured.  Treatment planning then occurred.  The radiation prescription was entered and confirmed.  I have requested 3D planning  I have requested a DVH of the following structures: Brain stem, brain, left eye, right eye, lenses, optic chiasm, target volumes, uninvolved brain, and normal tissue.    SPECIAL TREATMENT PROCEDURE:  The planned course of therapy using radiation constitutes a special treatment procedure. Special care is required in the management of this patient for the following reasons. This treatment constitutes a Special Treatment Procedure for the following reason: High dose per fraction requiring special monitoring for increased toxicities of treatment including daily imaging.   The special nature of the planned course of radiotherapy will require increased physician supervision and oversight to ensure patient's safety with optimal treatment outcomes.  PLAN:  The patient will receive 16 Gy in 1 fraction.  ________________________________------------------------------------------------   Margaretmary Dys, MD [facility]  Radiation Oncology Medical Director and Director of Stereotactic Radiosurgery Direct Dial: [phone]  Fax: [phone] [email or web address]  LinkedIn

## 2021-04-26 DIAGNOSIS — Z510 Encounter for antineoplastic radiation therapy: Secondary | ICD-10-CM | POA: Diagnosis not present

## 2021-05-03 ENCOUNTER — Encounter: Payer: Self-pay | Admitting: Radiation Oncology

## 2021-05-03 ENCOUNTER — Ambulatory Visit
Admission: RE | Admit: 2021-05-03 | Discharge: 2021-05-03 | Disposition: A | Discharge status: Home / Self Care | Payer: Managed Care, Other (non HMO) | Source: Ambulatory Visit | Attending: Radiation Oncology | Admitting: Radiation Oncology

## 2021-05-03 VITALS — BP 129/86 | HR 84 | Temp 97.5°F | Resp 16

## 2021-05-03 DIAGNOSIS — Q282 Arteriovenous malformation of cerebral vessels: Secondary | ICD-10-CM

## 2021-05-03 DIAGNOSIS — Z510 Encounter for antineoplastic radiation therapy: Secondary | ICD-10-CM | POA: Diagnosis not present

## 2021-05-03 NOTE — Op Note (Signed)
  Name: Harold Bright  MRN: [id number]  Date: 05/03/2021   DOB: 12/05/1980  Stereotactic Radiosurgery Operative Note  PRE-OPERATIVE DIAGNOSIS:  Arteriovenous malformation  POST-OPERATIVE DIAGNOSIS:  Same  PROCEDURE:  Stereotactic Radiosurgery  SURGEON:  Jadene Pierini, MD  NARRATIVE: The patient underwent a radiation treatment planning session in the radiation oncology simulation suite under the care of the radiation oncology physician and physicist.  I participated closely in the radiation treatment planning afterwards. The patient underwent planning CT which was fused to 3T high resolution MRI with 1 mm axial slices.  These images were fused on the planning system.  We contoured the gross target volumes and subsequently expanded this to yield the Planning Target Volume. I actively participated in the planning process.  I helped to define and review the target contours and also the contours of the optic pathway, eyes, brainstem and selected nearby organs at risk.  All the dose constraints for critical structures were reviewed and compared to AAPM Task Group 101.  The prescription dose conformity was reviewed.  I approved the plan electronically.    Accordingly, Harold Bright was brought to the TrueBeam stereotactic radiation treatment linac and placed in the custom immobilization mask.  The patient was aligned according to the IR fiducial markers with BrainLab Exactrac, then orthogonal x-rays were used in ExacTrac with the 6DOF robotic table and the shifts were made to align the patient  Harold Bright received stereotactic radiosurgery uneventfully.    Lesions treated:  1   Complex lesions treated:  0 (>3.5 cm, <50mm of optic path, or within the brainstem)   The detailed description of the procedure is recorded in the radiation oncology procedure note.  I was present for the duration of the procedure.  DISPOSITION:  Following delivery, the patient was  transported to nursing in stable condition and monitored for possible acute effects to be discharged to home in stable condition with follow-up in one month.  Jadene Pierini, MD 05/03/2021 7:00 PM

## 2021-05-03 NOTE — Addendum Note (Signed)
Encounter addended by: Jadene Pierini, MD on: 05/03/2021 7:04 PM  Actions taken: Clinical Note Signed

## 2021-05-03 NOTE — Progress Notes (Signed)
Mr. Hur rested with [place] for 30 minutes following his SRS treatment.  Patient denies headache, dizziness, nausea, diplopia or ringing in the ears. Denies fatigue. Patient without complaints. Understands to avoid strenuous activity for the next 24 hours and call [phone] with needs.   BP 129/86 (BP Location: Left Arm)   Pulse 84   Temp (!) 97.5 F (36.4 C)   Resp 16   SpO2 100%    Lenay Lovejoy M. Vickii Chafe, BSN

## 2021-05-03 NOTE — Progress Notes (Signed)
  Radiation Oncology         [id number]) [phone] ________________________________  Stereotactic Treatment Procedure Note  Name: Harold Bright MRN: [id number]  Date: 05/03/2021  DOB: [birth year]  SPECIAL TREATMENT PROCEDURE    ICD-10-CM   1. 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  It showed 3-dimensional radiation distributions overlaid onto the planning CT/MRI image set.  [facility] for the target structures as well as the organs at risk were reviewed. The documentation of the 3D plan and dosimetry are filed in the radiation oncology EMR.  NARRATIVE:  Harold Bright was brought to the TrueBeam stereotactic radiation treatment machine and placed supine on the CT couch. The head frame was applied, and the patient was set up for stereotactic radiosurgery.  Neurosurgery was present for the set-up and delivery  SIMULATION VERIFICATION:  In the couch zero-angle position, the patient underwent Exactrac imaging using the Brainlab system with orthogonal KV images.  These were carefully aligned and repeated to confirm treatment position for each of the isocenters.  The Exactrac snap film verification was repeated at each couch angle.  PROCEDURE: Harold Bright received stereotactic radiosurgery to the following targets: Left periventricular 2.5 cm AVM target was treated using 4 Rapid Arc VMAT Beams to a prescription dose of 16 Gy.  ExacTrac registration was performed for each couch angle.  The 100% isodose line was prescribed.  6 MV X-rays were delivered in the flattening filter free beam mode.  STEREOTACTIC TREATMENT MANAGEMENT:  Following delivery, the patient was transported to nursing in stable condition and monitored for possible acute effects.  Vital signs were recorded BP 129/86 (BP Location: Left Arm)   Pulse 84    Temp (!) 97.5 F (36.4 C)   Resp 16   SpO2 100% .  Harold Bright rested with [place] for 30 minutes following his SRS treatment. The patient tolerated treatment without significant acute effects, and was discharged to home in stable condition.    PLAN: Follow-up in one month.  ________________________________  Artist Pais. Kathrynn Running, M.D.

## 2021-06-07 ENCOUNTER — Encounter: Payer: Self-pay | Admitting: Urology

## 2021-06-07 NOTE — Progress Notes (Signed)
Patient is doing well. Denies headaches, fatigue, skin irritation, vision/ hearing changes, nausea, motor skill issues, cognitive changes, aphasia, and oral issues.  Meaningful use questions complete and patient notified of 1:30pm telephone visit on 06/09/21 and expressed understanding.

## 2021-06-09 ENCOUNTER — Ambulatory Visit
Admission: RE | Admit: 2021-06-09 | Discharge: 2021-06-09 | Disposition: A | Discharge status: Home / Self Care | Payer: Managed Care, Other (non HMO) | Source: Ambulatory Visit | Attending: Urology | Admitting: Urology

## 2021-06-09 NOTE — Progress Notes (Signed)
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  Interval Since Last Radiation:  5 weeks  05/03/21//SRS:  The left splenial AVM was treated to 16 Gy in 1 fraction.  Narrative:  I spoke with the patient to conduct his routine scheduled 1 month follow up visit via telephone to spare the patient unnecessary potential exposure in the healthcare setting during the current COVID-19 pandemic.  The patient was notified in advance and gave permission to proceed with this visit format.  He tolerated treatment without any acute ill side effects.                              On review of systems, the patient states that he is doing well in general and is currently without complaints. He reports having a mild headache the evening of his treatment but this resolved with Tylenol and has not recurred. He denies changes in his auditory or visual acuity, N/V, focal weakness, imbalance or difficulty with speech. His energy level is unchanged and he reports a healthy appetite. Overall, he is pleased with his progress to date.  ALLERGIES:  is allergic to penicillins and sulfa antibiotics.  Meds: Current Outpatient Medications  Medication Sig Dispense Refill   cetirizine (ZYRTEC) 10 MG tablet Take 10 mg by mouth every morning.     fluticasone (FLONASE) 50 MCG/ACT nasal spray Place 1 spray into both nostrils daily.     Multiple Vitamin (MULTIVITAMIN WITH MINERALS) TABS tablet Take 1 tablet by mouth every morning.     olmesartan (BENICAR) 20 MG tablet Take 20 mg by mouth every morning.     Omega-3 Fatty Acids (FISH OIL PO) Take 2 capsules by mouth every morning.     rosuvastatin (CRESTOR) 10 MG tablet Take 10 mg by mouth  at bedtime.     ibuprofen (ADVIL) 200 MG tablet Take 400 mg by mouth daily as needed for headache (pain). (Patient not taking: No sig reported)     ondansetron (ZOFRAN) 4 MG tablet Take 1 tablet (4 mg total) by mouth daily as needed for nausea or vomiting. (Patient not taking: No sig reported) 15 tablet 1   No current facility-administered medications for this encounter.    Physical Findings:  vitals were not taken for this visit.  Pain Assessment Pain Score: 0-No pain/10 Unable to assess due to telephone follow up visit format.  Lab Findings: Lab Results  Component Value Date   WBC 9.2 03/05/2021   HGB 16.3 03/05/2021   HCT 48.0 03/05/2021   MCV 92.6 03/05/2021   PLT 299 03/05/2021     Radiographic Findings: No results found.  Impression/Plan: 64. 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 He appears to have recovered well from the effects of his recent radiotherapy and is currently without complaints.  We discussed that while we are happy to continue participate in his care if clinically indicated, at this point, we will plan to see him back on an as-needed basis.  He will continue in routine follow-up under the care and direction of Dr. Maurice Small and is scheduled for a follow-up visit on 06/16/2021.  We  enjoyed taking care of him and look forward to continuing to follow his progress via correspondence.  He knows that he is welcome to call at anytime with any questions or concerns related to his previous radiation.    Marguarite Arbour, PA-C

## 2021-07-04 ENCOUNTER — Ambulatory Visit (INDEPENDENT_AMBULATORY_CARE_PROVIDER_SITE_OTHER): Payer: Managed Care, Other (non HMO) | Admitting: Neurology

## 2021-07-04 ENCOUNTER — Encounter: Payer: Self-pay | Admitting: Neurology

## 2021-07-04 VITALS — BP 143/90 | HR 94 | Ht 72.0 in | Wt 317.0 lb

## 2021-07-04 DIAGNOSIS — Q282 Arteriovenous malformation of cerebral vessels: Secondary | ICD-10-CM

## 2021-07-04 DIAGNOSIS — I615 Nontraumatic intracerebral hemorrhage, intraventricular: Secondary | ICD-10-CM | POA: Diagnosis not present

## 2021-07-04 NOTE — Patient Instructions (Signed)
I had a long discussion with the patient regarding his intraventricular hemorrhage and AV malformation and radiosurgery treatment and answered questions.  I recommend he maintain strict control of hypertension with blood pressure goal below 130/90 and keep scheduled follow-up with Dr. Johnsie Cancel neurosurgeon for follow-up vascular imaging and additional treatment if necessary.  He was also encouraged to eat a healthy diet with lots of fruits, vegetables, cereals and whole grains and also to exercise regularly and lose weight.  I also advised him to have outpatient evaluation for sleep apnea through his primary care physician.  He will return for follow-up with me in the future only if necessary and no schedule appointment was made. Brain Arteriovenous Malformation, Adult A brain arteriovenous malformation (AVM) is a condition that means your arteries and veins are tangled in a certain area of the brain. The veins bring blood to the heart and lungs. The arteries bring blood away from the heart and to the brain. If they are tangled, blood cannot travel to where it is needed. Brain AVM may also lead to bleeding in the brain (hemorrhage), which can cause stroke symptoms and be life-threatening. Most brain AVMs are present since birth (congenital). What are the causes? It is not known what causes brain AVM. Genes that are passed down through families may cause some types of AVM. What are the signs or symptoms? Symptoms of this condition depend on which area of the brain is affected and the amount of damage. Symptoms may include: Headache. Dizziness. Vision changes. Seizure. Weakness or loss of movement in part of the body. Tingling or numbness in part of the body. Loss of ability to speak. Clumsiness. Confusion or memory loss. Fainting, if the AVM ruptures. In some cases, there are no symptoms. How is this diagnosed? Your health care provider may suspect a brain AVM based on your symptoms and medical  history. You will have a physical exam and will also have tests done, which may include: An MRI or magnetic resonance angiogram (MRA). A CT scan or a CT angiogram (CTA). A cerebral angiogram. An electroencephalogram (EEG) if your health care provider thinks that you have had a seizure. How is this treated? Treatment will depend on the size, location, and severity of the brain AVM. Treatment may include: Surgery to remove the AVM (craniotomy). Embolization. This involves closing off the blood vessels of the AVM by injecting glue into them or inserting a coil. Radiosurgery. This involves focusing radiation to the AVM. Medicines to control your symptoms, such as seizures or headaches. Monitoring the AVM with imaging tests to make sure it is not growing. Follow these instructions at home: Learn as much as you can about your condition and work closely with your team of health care providers. Take over-the-counter and prescription medicines only as told by your health care provider. Do not take blood thinners--such as aspirin, ibuprofen, NSAIDs, or warfarin--unless your health care provider tells you to do that. Keep all follow-up visits as told by your health care provider. This is important. Get help right away if you have: A sudden, severe headache with no known cause. Nausea or vomiting occurring with another symptom. Sudden weakness or numbness of your face, arm, or leg, especially on one side of your body. Sudden trouble walking or difficulty moving your arms or legs. Sudden confusion. Sudden trouble speaking, understanding, or both (aphasia). Sudden trouble seeing in one or both of your eyes. A sudden loss of balance or coordination. A stiff neck. Difficulty breathing. Partial or  total loss of consciousness. These symptoms may represent a serious problem that is an emergency. Do not wait to see if the symptoms will go away. Get medical help right away. Call your local emergency services  (911 in the [place] Do not drive yourself to the hospital. Summary A brain arteriovenous malformation (AVM) is a condition that means your arteries and veins are tangled in a certain area of the brain, which can prevent blood from flowing to where it is needed. Most AVMs are present from birth and do not cause any significant symptoms. Some AVMs can be large and can lead to bleeding and cause symptoms of a stroke or a seizure. This condition is usually diagnosed with brain imaging tests, such as an MRI or a CT scan. Not all AVMs need treatment. If treatment is needed, it may include surgery, radiation, or a procedure to close off the blood vessels of the AVM. This information is not intended to replace advice given to you by your health care provider. Make sure you discuss any questions you have with your health care provider. Document Revised: 11/29/2020 Document Reviewed: 07/19/2018 Elsevier Patient Education  2022 [employer].

## 2021-07-04 NOTE — Progress Notes (Signed)
[facility] [address] [place]. [place] [address] [phone]       OFFICE FOLLOW-UP NOTE  Mr. Thong Feeny Date of Birth:  [birth year] Medical Record Number:  [id number]   HPI: Mr. Fitchett is a pleasant 40 year old Caucasian male seen today for initial office follow-up visit following hospital admission for intracerebral hemorrhage in May 2022.  History is obtained from the patient and review of electronic medical records and I personally reviewed pertinent available imaging films in PACS.  He has past medical history of hypertension, ocular migraines, hyperlipidemia, allergies who presented with sudden onset of headache and visual disturbance on 03/05/2021.  He also had an episode of vomiting in the emergency room and stat CT scan of the head was obtained which showed moderate volume intra ventricular hemorrhage with blood in the left greater than right lateral ventricle, third ventricle and fourth ventricle.  There is no hydrocephalus.  ICH score was 1.  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  Arterial supply appeared to be from distal anterior cerebral artery.  Subsequently an MRI scan of the brain was obtained which showed the nidus measuring 2.5 x 2.4 x 2.6 cm with AVM involving the splenium of the corpus callosum primarily on the left side.  Cerebral catheter angiogram was performed on 03/10/2021 by Dr. Conchita Paris which confirmed Spetzler-Martin grade 3 left splenial AVM supplied primarily through the left PCA and left ACA and venous drainage into the vein of Galen.  Patient's headache and visual symptoms resolved and did very well.  He subsequently had outpatient stereotactic radiosurgery on 05/03/2021 by Dr. Kathrynn Running.  Patient states she is done well.  Has had no headache or visual symptoms dizziness.  Is fully independent and is back to  work.  Is Nurse, learning disability.  He has appointment to see Dr. Johnsie Cancel who plans to repeat angiogram in about a year to see if he needs any further treatment.  He states his blood pressure is well controlled on Benicar today it is slightly borderline in office at 143/90.  He has no complaints.  There is no family history of intracerebral hemorrhage or AVMs.  ROS:   14 system review of systems is positive for no complaints today all other systems negative  PMH:  Past Medical History:  Diagnosis Date   Hypertension     Social History:  Social History   Socioeconomic History   Marital status: Married    Spouse name: [place]   Number of children: Not on file   Years of education: Not on file   Highest education level: Not on file  Occupational History   Not on file  Tobacco Use   Smoking status: Never   Smokeless tobacco: Never  Vaping Use   Vaping Use: Never used  Substance and Sexual Activity   Alcohol use: Yes    Comment: 2 a day   Drug use: Never   Sexual activity: Not on file  Other Topics Concern   Not on file  Social History Narrative   Lives with wife and daughter at home   Right Handed   Drinks 1-2 cups caffeine daily   Social Determinants of Health   Financial Resource Strain: Not on file  Food Insecurity: Not on file  Transportation Needs: Not on file  Physical Activity: Not on file  Stress: Not on file  Social Connections: Not on file  Intimate Partner Violence: Not on file    Medications:   Current Outpatient Medications on File Prior to Visit  Medication Sig Dispense Refill   cetirizine (ZYRTEC) 10 MG tablet Take 10 mg by mouth every morning.     fluticasone (FLONASE) 50 MCG/ACT nasal spray Place 1 spray into both nostrils daily.     Multiple Vitamin (MULTIVITAMIN WITH MINERALS) TABS tablet Take 1 tablet by mouth every morning.     olmesartan (BENICAR) 20 MG tablet Take 20 mg by mouth every morning.     Omega-3 Fatty Acids (FISH OIL PO) Take  2 capsules by mouth every morning.     rosuvastatin (CRESTOR) 10 MG tablet Take 10 mg by mouth at bedtime.     No current facility-administered medications on file prior to visit.    Allergies:   Allergies  Allergen Reactions   Penicillins Hives   Sulfa Antibiotics Hives    Physical Exam General: well developed, well nourished, seated, in no evident distress Head: head normocephalic and atraumatic.  Neck: supple with no carotid or supraclavicular bruits Cardiovascular: regular rate and rhythm, no murmurs Musculoskeletal: no deformity Skin:  no rash/petichiae Vascular:  Normal pulses all extremities Vitals:   07/04/21 0838  BP: (!) 143/90  Pulse: 94   Neurologic Exam Mental Status: Awake and fully alert. Oriented to place and time. Recent and remote memory intact. Attention span, concentration and fund of knowledge appropriate. Mood and affect appropriate.  Cranial Nerves: Fundoscopic exam reveals sharp disc margins. Pupils equal, briskly reactive to light. Extraocular movements full without nystagmus. Visual fields full to confrontation. Hearing intact. Facial sensation intact. Face, tongue, palate moves normally and symmetrically.  Motor: Normal bulk and tone. Normal strength in all tested extremity muscles. Sensory.: intact to touch ,pinprick .position and vibratory sensation.  Coordination: Rapid alternating movements normal in all extremities. Finger-to-nose and heel-to-shin performed accurately bilaterally. Gait and Station: Arises from chair without difficulty. Stance is normal. Gait demonstrates normal stride length and balance . Able to heel, toe and tandem walk without difficulty.  Reflexes: 1+ and symmetric. Toes downgoing.   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  I recommend he maintain strict control of hypertension with blood pressure goal below 130/90 and keep scheduled follow-up with Dr. Johnsie Cancel neurosurgeon for follow-up vascular imaging and additional treatment if necessary.  He was also encouraged to eat a healthy diet with lots of fruits, vegetables, cereals and whole grains and also to exercise regularly and lose weight.  I also advised him to have outpatient evaluation for sleep apnea through his primary care physician.  He will return for follow-up with me in the future only if necessary and no schedule appointment was made. Greater than 50% of time during this 25 minute visit was spent on counseling,explanation of diagnosis of intra ventricular hemorrhage, AV malformation planning of further management, discussion with patient and family and coordination of care Delia Heady, MD Note: This document was prepared with digital dictation and possible smart phrase technology. Any transcriptional errors that result from this process are unintentional

## 2021-07-24 NOTE — Progress Notes (Signed)
  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  ExacTrac registration was performed for each couch angle.  The 100% isodose line was prescribed.  6 MV X-rays were delivered in the flattening filter free beam mode.  Narrative: The patient tolerated radiation treatment relatively well.     Plan: The patient has completed radiation treatment. The patient will return to radiation oncology clinic for routine followup in one month. I advised him to call or return sooner if he has any questions or concerns related to his recovery or treatment. ________________________________  Artist Pais. Kathrynn Running, M.D.

## 2022-05-01 ENCOUNTER — Other Ambulatory Visit: Payer: Self-pay | Admitting: Neurological Surgery

## 2022-05-03 ENCOUNTER — Other Ambulatory Visit: Payer: Self-pay | Admitting: Neurological Surgery

## 2022-05-03 ENCOUNTER — Other Ambulatory Visit (HOSPITAL_COMMUNITY): Payer: Self-pay | Admitting: Neurological Surgery

## 2022-05-03 DIAGNOSIS — Q2730 Arteriovenous malformation, site unspecified: Secondary | ICD-10-CM

## 2022-05-17 IMAGING — CT CT HEAD W/O CM
4 series · 15 of 47 positions shown, 17 images · non-contrast
Comparison: CT head 03/05/2021

CLINICAL DATA: Hydrocephalus.  Intracranial hemorrhage

EXAM:
CT HEAD WITHOUT CONTRAST
TECHNIQUE: Contiguous axial images were obtained from the base of the skull
through the vertex without intravenous contrast.

[Series 3: head bone · axial · 0.49mm/px · z∈[-45,-27]mm · 2 of 87 slices shown]
[im 9/87  bone]
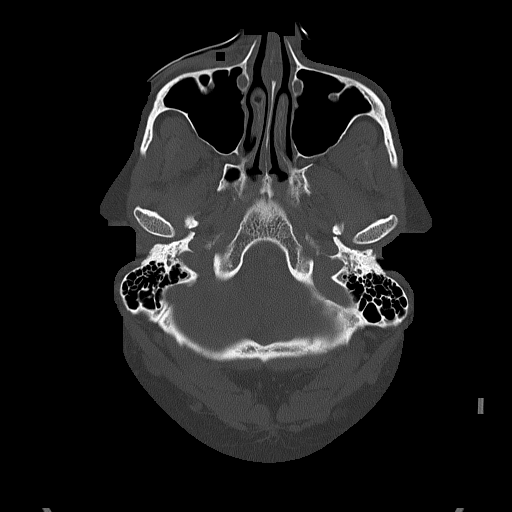
[im 18/87  bone]
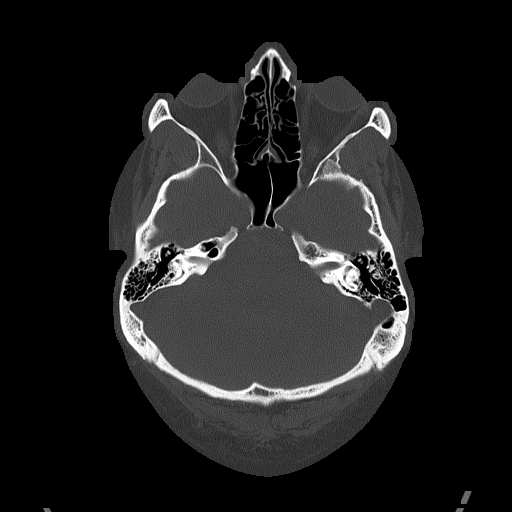

[Series 4: head without · axial · non-contrast · 0.49mm/px · z∈[-41,+84]mm · 7 of 35 slices shown, 9 images]
[im 5/35  brain]
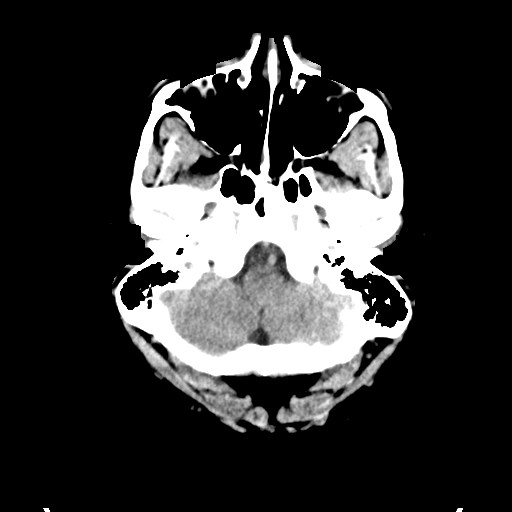
[im 5/35  bone]
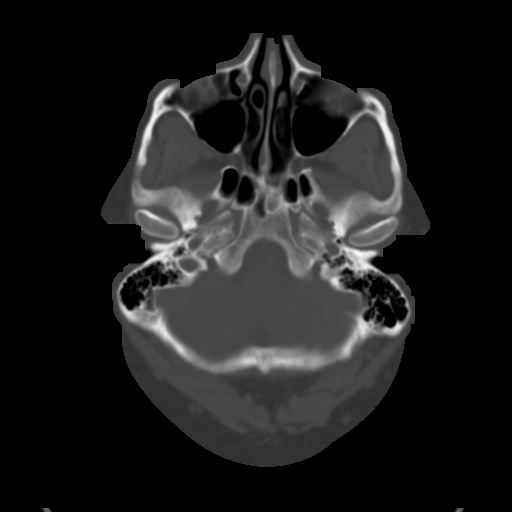
[im 9/35  brain]
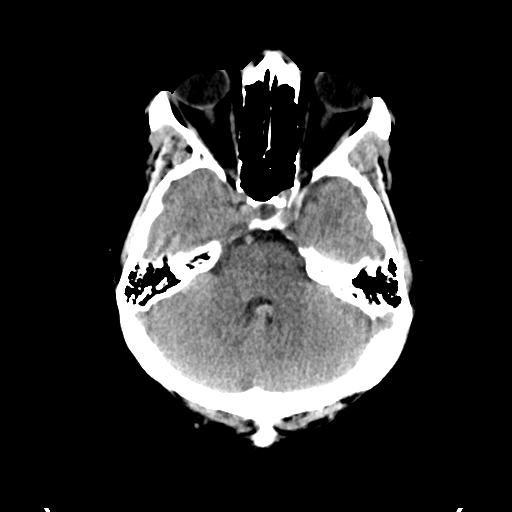
[im 13/35  brain]
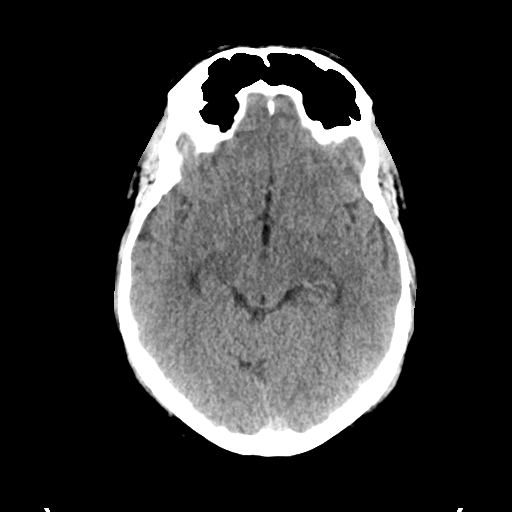
[im 18/35  brain]
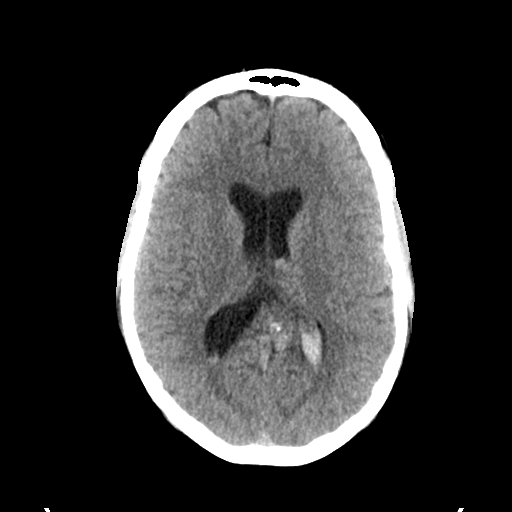
[im 22/35  brain]
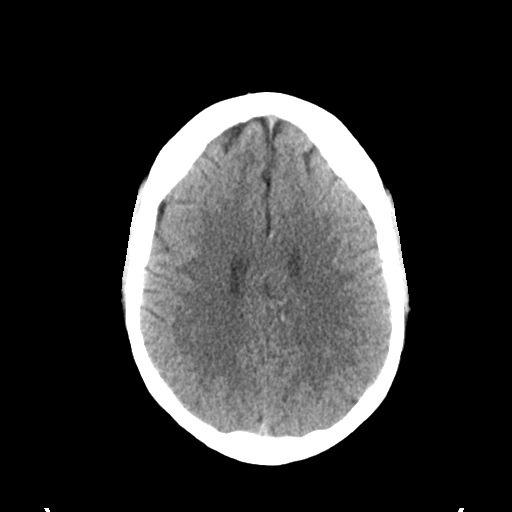
[im 22/35  bone]
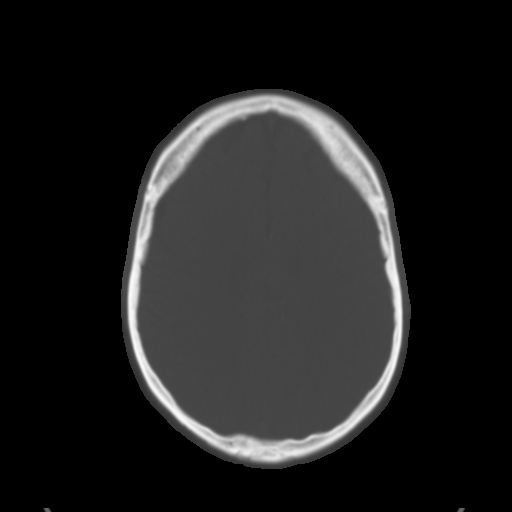
[im 26/35  brain]
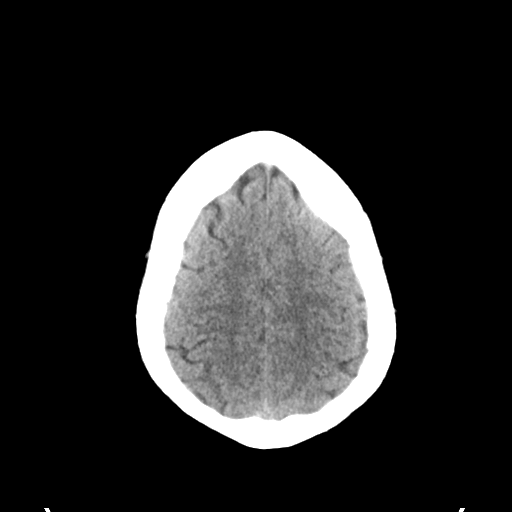
[im 30/35  brain]
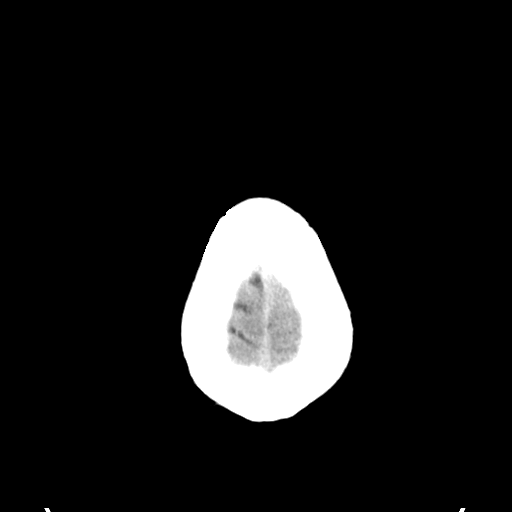

[Series 5: head without cor · coronal · non-contrast · 0.34mm/px · 3 of 77 slices shown]
[im 26/77  brain]
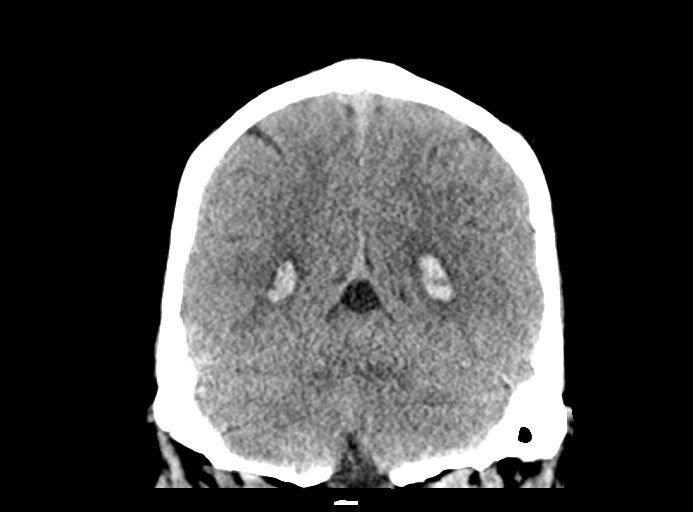
[im 34/77  brain]
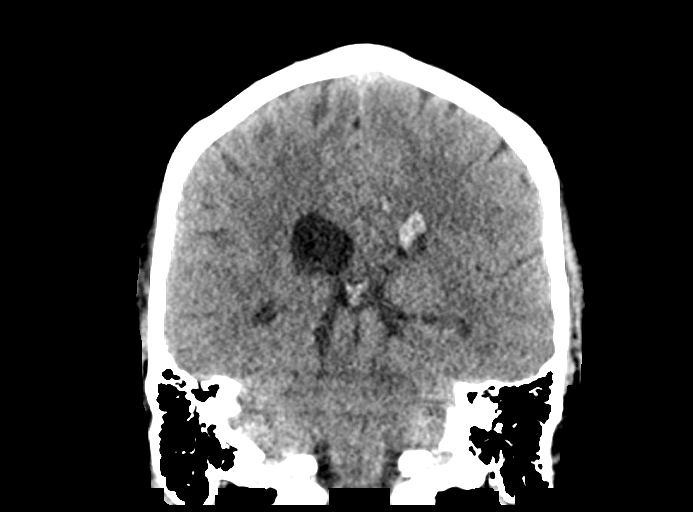
[im 43/77  brain]
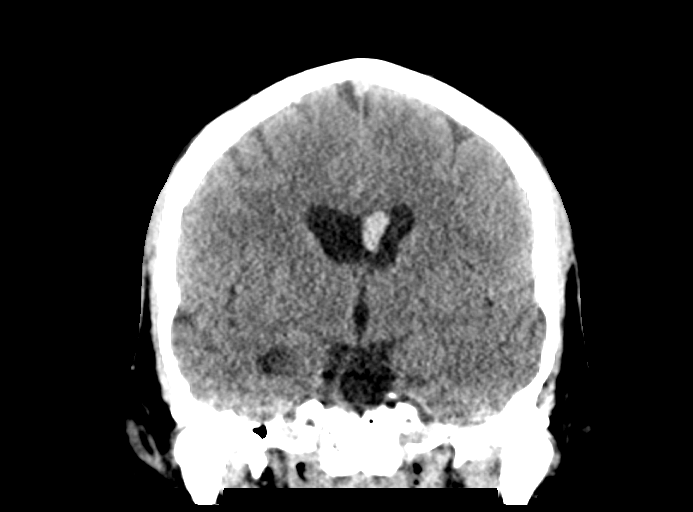

[Series 6: head without sag · sagittal · non-contrast · 0.32mm/px · 3 of 60 slices shown]
[im 20/60  brain]
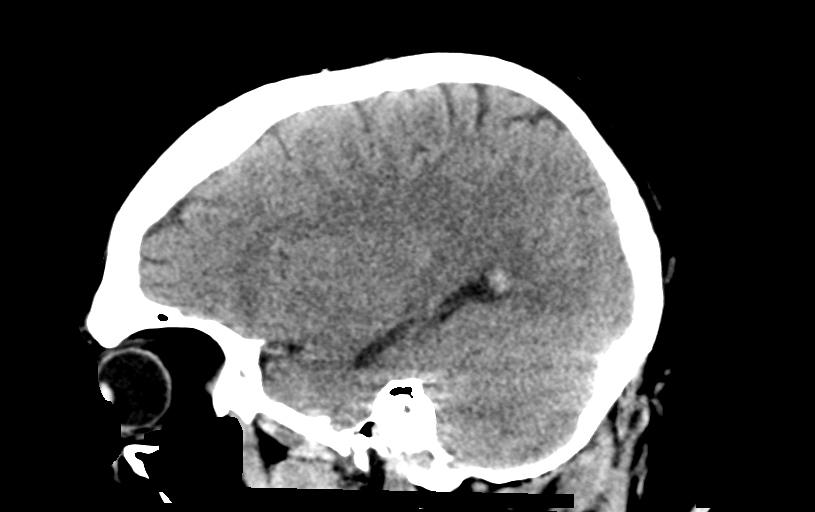
[im 30/60  brain]
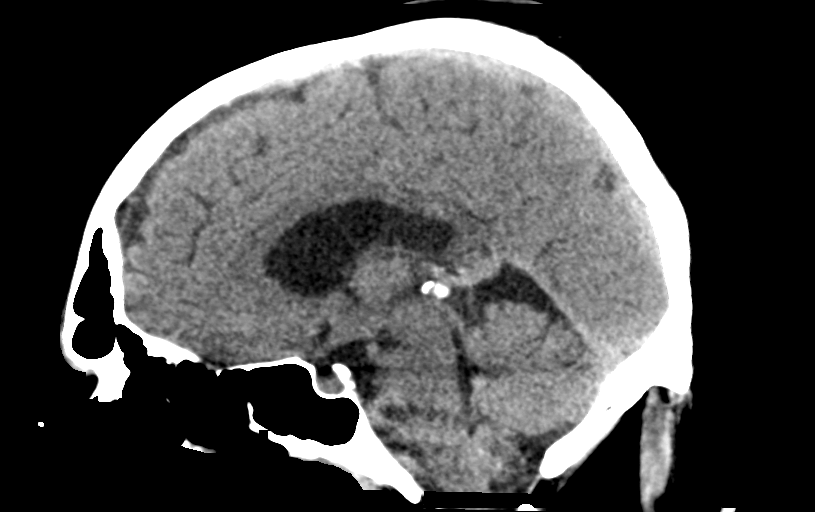
[im 40/60  brain]
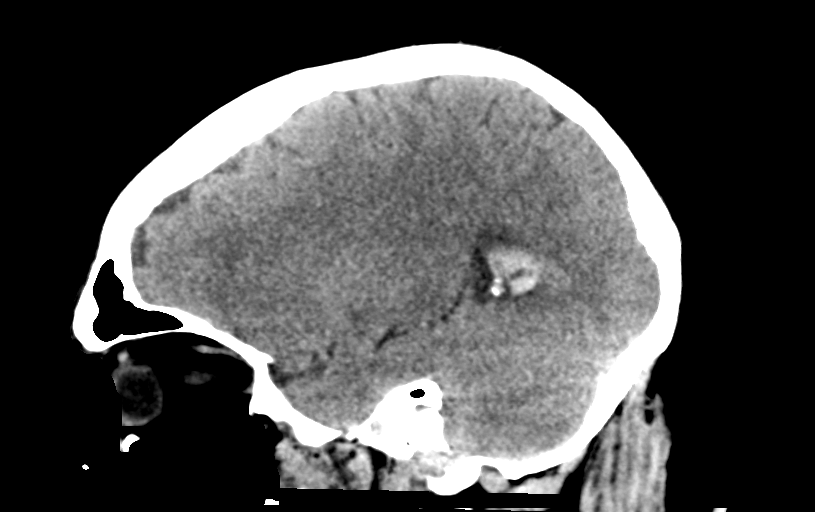

[15 of 47 positions shown; findings below may reference images not displayed]

FINDINGS: Brain: Intraventricular hemorrhage left greater than right has
improved. The no new hemorrhage. Mild ventricular enlargement stable
from the prior study.

AVM in the splenium of the corpus callosum again noted. No acute
infarct.

Vascular: Negative for hyperdense vessel. AVM in the splenium of the
corpus callosum on the left with punctate calcifications.

Skull: Negative

Sinuses/Orbits: Minimal mucosal edema paranasal sinuses. Negative
orbit

Other: None
IMPRESSION: Improving intraventricular hemorrhage. No new hemorrhage. Mild
ventricular enlargement, stable

AVM splenium of the corpus callosum.

## 2022-05-21 ENCOUNTER — Ambulatory Visit (HOSPITAL_COMMUNITY)
Admission: RE | Admit: 2022-05-21 | Discharge: 2022-05-21 | Disposition: A | Discharge status: Home / Self Care | Payer: Managed Care, Other (non HMO) | Source: Ambulatory Visit | Attending: Neurological Surgery | Admitting: Neurological Surgery

## 2022-05-21 DIAGNOSIS — Q2730 Arteriovenous malformation, site unspecified: Secondary | ICD-10-CM | POA: Diagnosis present

## 2022-05-21 MED ORDER — GADOBUTROL 1 MMOL/ML IV SOLN
14.0000 mL | Freq: Once | INTRAVENOUS | Status: AC | PRN
  Administered 2022-05-21: 14 mL via INTRAVENOUS

## 2022-12-07 ENCOUNTER — Encounter: Payer: Self-pay | Admitting: Neurology

## 2022-12-07 ENCOUNTER — Ambulatory Visit (INDEPENDENT_AMBULATORY_CARE_PROVIDER_SITE_OTHER): Payer: Managed Care, Other (non HMO) | Admitting: Neurology

## 2022-12-07 ENCOUNTER — Telehealth: Payer: Self-pay | Admitting: Neurology

## 2022-12-07 VITALS — BP 132/86 | HR 88 | Ht 72.0 in | Wt 321.5 lb

## 2022-12-07 DIAGNOSIS — H53461 Homonymous bilateral field defects, right side: Secondary | ICD-10-CM

## 2022-12-07 DIAGNOSIS — Z8679 Personal history of other diseases of the circulatory system: Secondary | ICD-10-CM | POA: Diagnosis not present

## 2022-12-07 DIAGNOSIS — H539 Unspecified visual disturbance: Secondary | ICD-10-CM | POA: Diagnosis not present

## 2022-12-07 DIAGNOSIS — R480 Dyslexia and alexia: Secondary | ICD-10-CM | POA: Diagnosis not present

## 2022-12-07 NOTE — Progress Notes (Signed)
[facility] [address] [place]. [place] [address] [phone]       OFFICE FOLLOW-UP NOTE  Mr. Andron Beattie Date of Birth:  03-Oct-1981 Medical Record Number:  [id number]   HPI: Prior visit 07/04/2021 Mr. Paolini is a pleasant 42 year old Caucasian male seen today for initial office follow-up visit following hospital admission for intracerebral hemorrhage in May 2022.  History is obtained from the patient and review of electronic medical records and I personally reviewed pertinent available imaging films in PACS.  He has past medical history of hypertension, ocular migraines, hyperlipidemia, allergies who presented with sudden onset of headache and visual disturbance on 03/05/2021.  He also had an episode of vomiting in the emergency room and stat CT scan of the head was obtained which showed moderate volume intra ventricular hemorrhage with blood in the left greater than right lateral ventricle, third ventricle and fourth ventricle.  There is no hydrocephalus.  ICH score was 1.  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  Arterial supply appeared to be from distal anterior cerebral artery.  Subsequently an MRI scan of the brain was obtained which showed the nidus measuring 2.5 x 2.4 x 2.6 cm with AVM involving the splenium of the corpus callosum primarily on the left side.  Cerebral catheter angiogram was performed on 03/10/2021 by Dr. Kathyrn Sheriff which confirmed Spetzler-Martin grade 3 left splenial AVM supplied primarily through the left PCA and left ACA and venous drainage into the vein of Galen.  Patient's headache and visual symptoms resolved and did very well.  He subsequently had outpatient stereotactic radiosurgery on 05/03/2021 by Dr. Tammi Klippel.  Patient states she is done well.  Has had no headache or visual symptoms dizziness.  Is fully  independent and is back to work.  Is [employer] features editor.  He has appointment to see Dr. Venetia Constable who plans to repeat angiogram in about a year to see if he needs any further treatment.  He states his blood pressure is well controlled on Benicar today it is slightly borderline in office at 143/90.  He has no complaints.  There is no family history of intracerebral hemorrhage or AVMs. Update 12/07/2022: Patient is referred back to see me today after last visit a year and a half ago as he has a new episode last Tuesday.  He states he started with his usual visual aura in the right eye which gradually evolved over 20 minutes and he noticed some trouble reading.  He denies any headache.  He tried calling.  This lasted about an hour and patient finally left when he woke up it was gone.  He continues to have mild trouble breathing no conclusive of water to following his intracerebral hemorrhage but has no trouble writing.  His blood pressure has been under good control and today it is 132/86.  He had a follow-up MRI scan of the brain done on 05/21/2022.  Personally reviewed showed decreased size of the left splenium during 1.8 x 1.2 cm.  He was seen by Dr. Venetia Constable and told that he did not need a follow-up diagnostic catheter angiogram the AVM was no longer seen follow-up MRIs.  Denies any other new neurological symptoms.  He continues to have some partial right upper visual field peripheral vision loss on the right.  He did have a history of visual migraines in the past even prior to his intracerebral  hemorrhage but they had disappeared after endovascular treatment until last week hence he is is concerned about AVM recurrence and would like follow-up brain imaging ROS:   14 system review of systems is positive for vision disturbance, vision difficulties only today all other systems negative  PMH:  Past Medical History:  Diagnosis Date   Hypertension     Social History:  Social History   Socioeconomic  History   Marital status: Married    Spouse name: [place]   Number of children: Not on file   Years of education: Not on file   Highest education level: Not on file  Occupational History   Not on file  Tobacco Use   Smoking status: Never   Smokeless tobacco: Never  Vaping Use   Vaping Use: Never used  Substance and Sexual Activity   Alcohol use: Yes    Comment: 2 a day   Drug use: Never   Sexual activity: Not on file  Other Topics Concern   Not on file  Social History Narrative   Lives with wife and daughter at home   Right Handed   Drinks 1-2 cups caffeine daily   Social Determinants of Health   Financial Resource Strain: Not on file  Food Insecurity: Not on file  Transportation Needs: Not on file  Physical Activity: Not on file  Stress: Not on file  Social Connections: Not on file  Intimate Partner Violence: Not on file    Medications:   Current Outpatient Medications on File Prior to Visit  Medication Sig Dispense Refill   cetirizine (ZYRTEC) 10 MG tablet Take 10 mg by mouth every morning.     fluticasone (FLONASE) 50 MCG/ACT nasal spray Place 1 spray into both nostrils daily.     Multiple Vitamin (MULTIVITAMIN WITH MINERALS) TABS tablet Take 1 tablet by mouth every morning.     olmesartan (BENICAR) 20 MG tablet Take 20 mg by mouth every morning.     Omega-3 Fatty Acids (FISH OIL PO) Take 2 capsules by mouth every morning.     rosuvastatin (CRESTOR) 10 MG tablet Take 10 mg by mouth at bedtime.     WEGOVY 0.25 MG/0.5ML SOAJ Inject 0.25 mg into the skin once a week.     No current facility-administered medications on file prior to visit.    Allergies:   Allergies  Allergen Reactions   Penicillins Hives   Sulfa Antibiotics Hives    Physical Exam General: well developed, well nourished pleasant middle-aged Caucasian male, seated, in no evident distress Head: head normocephalic and atraumatic.  Neck: supple with no carotid or supraclavicular  bruits Cardiovascular: regular rate and rhythm, no murmurs Musculoskeletal: no deformity Skin:  no rash/petichiae Vascular:  Normal pulses all extremities Vitals:   12/07/22 1308  BP: 132/86  Pulse: 88   Neurologic Exam Mental Status: Awake and fully alert. Oriented to place and time. Recent and remote memory intact. Attention span, concentration and fund of knowledge appropriate. Mood and affect appropriate.  Cranial Nerves: Fundoscopic exam reveals sharp disc margins. Pupils equal, briskly reactive to light. Extraocular movements full without nystagmus. Visual fields full show partial right superior quadrantanopsia to confrontation. Hearing intact. Facial sensation intact. Face, tongue, palate moves normally and symmetrically.  Motor: Normal bulk and tone. Normal strength in all tested extremity muscles. Sensory.: intact to touch ,pinprick .position and vibratory sensation.  Coordination: Rapid alternating movements normal in all extremities. Finger-to-nose and heel-to-shin performed accurately bilaterally. Gait and Station: Arises from chair without difficulty. Stance  is normal. Gait demonstrates normal stride length and balance . Able to heel, toe and tandem walk without difficulty.  Reflexes: 1+ and symmetric. Toes downgoing.   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 He was doing quite well until last week when he had recurrence of visual symptoms and some trouble reading raising concern for AVM recurrence.    PLAN: I had a long discussion with patient episode of vision dysfunction like episode.  Given his prior history of left corpus callosum AVM endovascular treatment recommend MRI scan of the brain with and without contrast there is increase in lesions consider repeat cerebral catheter angiogram.   Encouraged to maintain aggressive blood pressure control with goal below 130/90 and to use his CPAP regularly for his obstructive sleep apnea Greater than 50% of time during this 35 minute visit was spent on counseling,explanation of diagnosis of intra ventricular hemorrhage, AV malformation planning of further management, discussion with patient and family and coordination of care Antony Contras, MD Note: This document was prepared with digital dictation and possible smart phrase technology. Any transcriptional errors that result from this process are unintentional

## 2022-12-07 NOTE — Patient Instructions (Signed)
I had a long discussion with patient episode of vision dysfunction like episode.  Given his prior history of left corpus callosum AVM endovascular treatment recommend MRI scan of the brain with and without contrast there is increase in lesions consider repeat cerebral catheter angiogram.  Encouraged to maintain aggressive blood pressure control with goal below 130/90 and to use his CPAP regularly for his obstructive sleep apnea

## 2022-12-07 NOTE — Telephone Encounter (Signed)
[employer] sent to GI they obtain auth [phone]

## 2022-12-20 ENCOUNTER — Encounter: Payer: Self-pay | Admitting: Neurology

## 2022-12-24 ENCOUNTER — Ambulatory Visit
Admission: RE | Admit: 2022-12-24 | Discharge: 2022-12-24 | Discharge status: Home / Self Care | Payer: Managed Care, Other (non HMO) | Source: Ambulatory Visit | Attending: Neurology | Admitting: Neurology

## 2022-12-24 DIAGNOSIS — H539 Unspecified visual disturbance: Secondary | ICD-10-CM | POA: Diagnosis not present

## 2022-12-24 MED ORDER — GADOPICLENOL 0.5 MMOL/ML IV SOLN
10.0000 mL | Freq: Once | INTRAVENOUS | Status: AC | PRN
  Administered 2022-12-24: 10 mL via INTRAVENOUS

## 2022-12-29 ENCOUNTER — Emergency Department (HOSPITAL_COMMUNITY): Payer: Managed Care, Other (non HMO)

## 2022-12-29 ENCOUNTER — Encounter (HOSPITAL_COMMUNITY): Payer: Self-pay

## 2022-12-29 ENCOUNTER — Emergency Department (HOSPITAL_COMMUNITY)
Admission: EM | Admit: 2022-12-29 | Discharge: 2022-12-29 | Disposition: A | Discharge status: Home / Self Care | Payer: Managed Care, Other (non HMO) | Attending: Emergency Medicine | Admitting: Emergency Medicine

## 2022-12-29 ENCOUNTER — Other Ambulatory Visit: Payer: Self-pay

## 2022-12-29 DIAGNOSIS — Z79899 Other long term (current) drug therapy: Secondary | ICD-10-CM | POA: Diagnosis not present

## 2022-12-29 DIAGNOSIS — R519 Headache, unspecified: Secondary | ICD-10-CM

## 2022-12-29 DIAGNOSIS — I10 Essential (primary) hypertension: Secondary | ICD-10-CM | POA: Diagnosis not present

## 2022-12-29 LAB — COMPREHENSIVE METABOLIC PANEL
ALT: 34 U/L (ref 0–44)
AST: 20 U/L (ref 15–41)
Albumin: 4.1 g/dL (ref 3.5–5.0)
Alkaline Phosphatase: 69 U/L (ref 38–126)
Anion gap: 13 (ref 5–15)
BUN: 16 mg/dL (ref 6–20)
CO2: 25 mmol/L (ref 22–32)
Calcium: 9.2 mg/dL (ref 8.9–10.3)
Chloride: 101 mmol/L (ref 98–111)
Creatinine, Ser: 1.1 mg/dL (ref 0.61–1.24)
GFR, Estimated: >60 mL/min (ref >60)
Glucose, Bld: 126 mg/dL — ABNORMAL HIGH (ref 70–99)
Potassium: 3.6 mmol/L (ref 3.5–5.1)
Sodium: 139 mmol/L (ref 135–145)
Total Bilirubin: 1.2 mg/dL (ref 0.3–1.2)
Total Protein: 7 g/dL (ref 6.5–8.1)

## 2022-12-29 LAB — CBC
HCT: 39.1 % (ref 39.0–52.0)
Hemoglobin: 13.4 g/dL (ref 13.0–17.0)
MCH: 30.6 pg (ref 26.0–34.0)
MCHC: 34.3 g/dL (ref 30.0–36.0)
MCV: 89.3 fL (ref 80.0–100.0)
Platelets: 245 10*3/uL (ref 150–400)
RBC: 4.38 MIL/uL (ref 4.22–5.81)
RDW: 12.5 % (ref 11.5–15.5)
WBC: 9.3 10*3/uL (ref 4.0–10.5)
nRBC: 0 % (ref 0.0–0.2)

## 2022-12-29 LAB — DIFFERENTIAL
Abs Immature Granulocytes: 0.02 10*3/uL (ref 0.00–0.07)
Basophils Absolute: 0.1 10*3/uL (ref 0.0–0.1)
Basophils Relative: 1 %
Eosinophils Absolute: 0.1 10*3/uL (ref 0.0–0.5)
Eosinophils Relative: 1 %
Immature Granulocytes: 0 %
Lymphocytes Relative: 26 %
Lymphs Abs: 2.4 10*3/uL (ref 0.7–4.0)
Monocytes Absolute: 0.6 10*3/uL (ref 0.1–1.0)
Monocytes Relative: 7 %
Neutro Abs: 6.1 10*3/uL (ref 1.7–7.7)
Neutrophils Relative %: 65 %

## 2022-12-29 LAB — I-STAT CHEM 8, ED
BUN: 17 mg/dL (ref 6–20)
Calcium, Ion: 1.15 mmol/L (ref 1.15–1.40)
Chloride: 101 mmol/L (ref 98–111)
Creatinine, Ser: 0.9 mg/dL (ref 0.61–1.24)
Glucose, Bld: 122 mg/dL — ABNORMAL HIGH (ref 70–99)
HCT: 39 % (ref 39.0–52.0)
Hemoglobin: 13.3 g/dL (ref 13.0–17.0)
Potassium: 3.5 mmol/L (ref 3.5–5.1)
Sodium: 140 mmol/L (ref 135–145)
TCO2: 25 mmol/L (ref 22–32)

## 2022-12-29 LAB — PROTIME-INR
INR: 1 (ref 0.8–1.2)
Prothrombin Time: 13.5 seconds (ref 11.4–15.2)

## 2022-12-29 LAB — APTT: aPTT: 30 seconds (ref 24–36)

## 2022-12-29 LAB — ETHANOL: Alcohol, Ethyl (B): <10 mg/dL (ref <10)

## 2022-12-29 LAB — CBG MONITORING, ED: Glucose-Capillary: 121 mg/dL — ABNORMAL HIGH (ref 70–99)

## 2022-12-29 MED ORDER — ACETAMINOPHEN 500 MG PO TABS
1000.0000 mg | ORAL_TABLET | Freq: Once | ORAL | Status: AC
  Administered 2022-12-29: 1000 mg via ORAL
  Filled 2022-12-29: qty 2

## 2022-12-29 NOTE — ED Triage Notes (Signed)
Pt took tylenol and advil without relief

## 2022-12-29 NOTE — ED Triage Notes (Signed)
Pt arrived to triage complaining of headache that started a couple of hours ago that has been getting worse. Pt denies vision changes today  Denies n/v at this time  Hx of intracerebral hemorrhage with similar symptoms

## 2022-12-29 NOTE — ED Provider Notes (Signed)
[facility] Provider Note  CSN: [id number] Arrival date & time: 12/29/22 0023  Chief Complaint(s) Headache  HPI Harold Bright is a 42 y.o. male with a past medical history listed below including cerebral AVM and prior IVH who presents to the emergency department with severe headache that began last night.  Pain was different than prior headache from IVH however the intensity was significant enough that prompted him to come in for evaluation.  Today's pain was sharp in nature.  No associated nausea or vomiting.  No visual disturbance.  No focal deficits.  He denies any recent fevers or infections.  Patient reports that he had a recent MRI as a follow-up for neurology and noted some increased edema from MRI in Aug.    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 Authorizing Provider  cetirizine (ZYRTEC) 10 MG tablet Take 10 mg by mouth every morning.    [provider]  fluticasone (FLONASE) 50 MCG/ACT nasal spray Place 1 spray into both nostrils daily.    [provider]  Multiple Vitamin (MULTIVITAMIN WITH MINERALS) TABS tablet Take 1 tablet by mouth every morning.    [provider]  olmesartan (BENICAR) 20 MG tablet Take 20 mg by mouth every morning. 12/07/20   [provider]  Omega-3 Fatty Acids (FISH OIL PO) Take 2 capsules by mouth every morning.    [provider]  rosuvastatin (CRESTOR) 10 MG tablet Take 10 mg by mouth at bedtime. 02/15/21   [provider]  WEGOVY 0.25 MG/0.5ML SOAJ Inject 0.25 mg into the skin  once a week. 10/29/22   [provider]                                                                                                                                    Allergies Penicillins and Sulfa antibiotics  Review of Systems Review of Systems  Neurological:  Positive for headaches.   As noted in HPI  Physical Exam Vital Signs  I have reviewed the triage vital signs BP (!) 144/95 (BP Location: Right Arm)   Pulse 98   Temp 99 F (37.2 C) (Oral)   Resp 20   Ht 6' (1.829 m)   Wt (!) 142.9 kg   SpO2 97%   BMI 42.72 kg/m   Physical Exam Vitals reviewed.  Constitutional:      General: He is not in acute distress.    Appearance: He is well-developed. He is not diaphoretic.  HENT:     Head: Normocephalic and atraumatic.  Right Ear: External ear normal.     Left Ear: External ear normal.     Nose: Nose normal.     Mouth/Throat:     Mouth: Mucous membranes are moist.  Eyes:     General: No scleral icterus.    Conjunctiva/sclera: Conjunctivae normal.  Neck:     Trachea: Phonation normal.  Cardiovascular:     Rate and Rhythm: Normal rate and regular rhythm.  Pulmonary:     Effort: Pulmonary effort is normal. No respiratory distress.     Breath sounds: No stridor.  Abdominal:     General: There is no distension.  Musculoskeletal:        General: Normal range of motion.     Cervical back: Normal range of motion.  Neurological:     Mental Status: He is alert and oriented to person, place, and time.  Psychiatric:        Behavior: Behavior normal.     ED Results and Treatments Labs (all labs ordered are listed, but only abnormal results are displayed) Labs Reviewed  COMPREHENSIVE METABOLIC PANEL - Abnormal; Notable for the following components:      Result Value   Glucose, Bld 126 (*)    All other components within normal limits  I-STAT CHEM 8, ED - Abnormal; Notable for the following components:   Glucose, Bld 122 (*)    All other components  within normal limits  CBG MONITORING, ED - Abnormal; Notable for the following components:   Glucose-Capillary 121 (*)    All other components within normal limits  PROTIME-INR  APTT  CBC  DIFFERENTIAL  ETHANOL                                                                                                                         EKG  EKG Interpretation  Date/Time:  Friday December 29 2022 02:10:50 EDT Ventricular Rate:  81 PR Interval:  144 QRS Duration: 94 QT Interval:  342 QTC Calculation: 397 R Axis:   5 Text Interpretation: Normal sinus rhythm Normal ECG No previous ECGs available Confirmed by Addison Lank [id number]) on 12/29/2022 3:51:55 AM       Radiology CT HEAD WO CONTRAST  Result Date: 12/29/2022 CLINICAL DATA:  Sudden severe headache EXAM: CT HEAD WITHOUT CONTRAST TECHNIQUE: Contiguous axial images were obtained from the base of the skull through the vertex without intravenous contrast. RADIATION DOSE REDUCTION: This exam was performed according to the departmental dose-optimization program which includes automated exposure control, adjustment of the mA and/or kV according to patient size and/or use of iterative reconstruction technique. COMPARISON:  Brain MRI 12/24/2022. FINDINGS: Brain: No acute hemorrhage. No extra-axial collection. Hypoattenuation within the white matter of both posterior hemispheres, involving the splenium of the corpus callosum and corresponding to the vascular lesion seen on recent brain MRI. Vascular: No hyperdense vessel or unexpected calcification. Skull: Normal. Negative for fracture or focal lesion. Sinuses/Orbits: No acute finding. Other: None. IMPRESSION: 1. No acute intracranial hemorrhage.  2. Hypoattenuation within the white matter at the site of known splenium AVM. Electronically Signed   By: Ulyses Jarred M.D.   On: 12/29/2022 03:21    Medications Ordered in ED Medications  acetaminophen (TYLENOL) tablet 1,000 mg (1,000 mg Oral Given 12/29/22  0344)                                                                                                                                     Procedures Procedures  (including critical care time)  Medical Decision Making / ED Course  Click here for ABCD2, HEART and other calculators  Medical Decision Making Amount and/or Complexity of Data Reviewed Labs: ordered. Decision-making details documented in ED Course. Radiology: ordered and independent interpretation performed. Decision-making details documented in ED Course. ECG/medicine tests: ordered and independent interpretation performed. Decision-making details documented in ED Course.  Risk OTC drugs.    Bad headache. No fevers or nuchal rigidity concerning for meningitis Given the patient's past medical history, will need to rule out acute bleed. Screening labs obtained. Other consideration is likely tension headache.  Patient reported initial headache 8 out of 10.  During my evaluation it was a 6 out of 10.  With thenar eminence pressure-point, pain completely subsided.  Labs reassuring without leukocytosis or anemia Metabolic panel without significant electrolyte derangements or renal sufficiency CT head negative for ICH.  Pain returned but mild at 2 out of 10.  Patient provided with Tylenol.  Patient is supposed to have an upcoming appointment with neurologist to follow-up for the MRI findings.      Final Clinical Impression(s) / ED Diagnoses Final diagnoses:  Bad headache   The patient appears reasonably screened and/or stabilized for discharge and I doubt any other medical condition or other [facility] requiring further screening, evaluation, or treatment in the ED at this time. I have discussed the findings, Dx and Tx plan with the patient/family who expressed understanding and agree(s) with the plan. Discharge instructions discussed at length. The patient/family was given strict return precautions who verbalized  understanding of the instructions. No further questions at time of discharge.  Disposition: Discharge  Condition: Good  ED Discharge Orders     None         Follow Up: Garvin Fila, MD [address] [place] [address] [phone]  Call             This chart was dictated using voice recognition software.  Despite best efforts to proofread,  errors can occur which can change the documentation meaning.    Fatima Blank, MD 12/29/22 [phone]

## 2022-12-29 NOTE — Progress Notes (Signed)
I called the patient and gave results of MRI scan showing slight increase in size of the AVM.  Advised him to call Dr. Venetia Constable and make an appointment to see him to discuss management options.  He voiced understanding

## 2022-12-29 NOTE — ED Notes (Signed)
Moves all extremities equally  Is alert and oriented x4

## 2023-01-02 ENCOUNTER — Other Ambulatory Visit: Payer: Managed Care, Other (non HMO)

## 2023-01-04 ENCOUNTER — Emergency Department (HOSPITAL_COMMUNITY)
Admission: EM | Admit: 2023-01-04 | Discharge: 2023-01-04 | Disposition: A | Discharge status: Home / Self Care | Payer: Managed Care, Other (non HMO) | Attending: Emergency Medicine | Admitting: Emergency Medicine

## 2023-01-04 ENCOUNTER — Emergency Department (HOSPITAL_COMMUNITY): Payer: Managed Care, Other (non HMO)

## 2023-01-04 ENCOUNTER — Other Ambulatory Visit: Payer: Self-pay

## 2023-01-04 ENCOUNTER — Encounter (HOSPITAL_COMMUNITY): Payer: Self-pay

## 2023-01-04 DIAGNOSIS — R519 Headache, unspecified: Secondary | ICD-10-CM | POA: Insufficient documentation

## 2023-01-04 DIAGNOSIS — I10 Essential (primary) hypertension: Secondary | ICD-10-CM | POA: Diagnosis not present

## 2023-01-04 LAB — CBC WITH DIFFERENTIAL/PLATELET
Abs Immature Granulocytes: 0.03 10*3/uL (ref 0.00–0.07)
Basophils Absolute: 0.1 10*3/uL (ref 0.0–0.1)
Basophils Relative: 1 %
Eosinophils Absolute: 0.1 10*3/uL (ref 0.0–0.5)
Eosinophils Relative: 1 %
HCT: 41.2 % (ref 39.0–52.0)
Hemoglobin: 13.9 g/dL (ref 13.0–17.0)
Immature Granulocytes: 0 %
Lymphocytes Relative: 24 %
Lymphs Abs: 1.7 10*3/uL (ref 0.7–4.0)
MCH: 30.3 pg (ref 26.0–34.0)
MCHC: 33.7 g/dL (ref 30.0–36.0)
MCV: 89.8 fL (ref 80.0–100.0)
Monocytes Absolute: 0.4 10*3/uL (ref 0.1–1.0)
Monocytes Relative: 6 %
Neutro Abs: 4.8 10*3/uL (ref 1.7–7.7)
Neutrophils Relative %: 68 %
Platelets: 265 10*3/uL (ref 150–400)
RBC: 4.59 MIL/uL (ref 4.22–5.81)
RDW: 12.7 % (ref 11.5–15.5)
WBC: 7.1 10*3/uL (ref 4.0–10.5)
nRBC: 0 % (ref 0.0–0.2)

## 2023-01-04 LAB — COMPREHENSIVE METABOLIC PANEL
ALT: 36 U/L (ref 0–44)
AST: 25 U/L (ref 15–41)
Albumin: 4.3 g/dL (ref 3.5–5.0)
Alkaline Phosphatase: 63 U/L (ref 38–126)
Anion gap: 8 (ref 5–15)
BUN: 6 mg/dL (ref 6–20)
CO2: 26 mmol/L (ref 22–32)
Calcium: 9.4 mg/dL (ref 8.9–10.3)
Chloride: 104 mmol/L (ref 98–111)
Creatinine, Ser: 0.99 mg/dL (ref 0.61–1.24)
GFR, Estimated: >60 mL/min (ref >60)
Glucose, Bld: 110 mg/dL — ABNORMAL HIGH (ref 70–99)
Potassium: 4.1 mmol/L (ref 3.5–5.1)
Sodium: 138 mmol/L (ref 135–145)
Total Bilirubin: 0.8 mg/dL (ref 0.3–1.2)
Total Protein: 7.3 g/dL (ref 6.5–8.1)

## 2023-01-04 MED ORDER — SODIUM CHLORIDE 0.9 % IV BOLUS
500.0000 mL | Freq: Once | INTRAVENOUS | Status: AC
  Administered 2023-01-04: 500 mL via INTRAVENOUS

## 2023-01-04 MED ORDER — IOHEXOL 350 MG/ML SOLN
75.0000 mL | Freq: Once | INTRAVENOUS | Status: AC | PRN
  Administered 2023-01-04: 75 mL via INTRAVENOUS

## 2023-01-04 MED ORDER — DEXAMETHASONE SODIUM PHOSPHATE 10 MG/ML IJ SOLN
10.0000 mg | Freq: Once | INTRAMUSCULAR | Status: AC
  Administered 2023-01-04: 10 mg via INTRAVENOUS
  Filled 2023-01-04: qty 1

## 2023-01-04 MED ORDER — ACETAMINOPHEN 500 MG PO TABS
500.0000 mg | ORAL_TABLET | Freq: Once | ORAL | Status: AC
  Administered 2023-01-04: 500 mg via ORAL
  Filled 2023-01-04: qty 1

## 2023-01-04 MED ORDER — DEXAMETHASONE 2 MG PO TABS: 2.0000 mg | ORAL_TABLET | Freq: Every day | ORAL | 0 refills | Status: AC

## 2023-01-04 NOTE — ED Triage Notes (Addendum)
Pt/wife stated, I have AVM , Dx May 2022 Ive had headache for 3 days straight with some memory loss like forgetting what was said and I repeat the same question.  I was here last Friday Morning early for the same. I take Tylenol and it takes the pain down.  In the Spring of May 22 I had a hemorraghic stroke.

## 2023-01-04 NOTE — ED Provider Notes (Signed)
Emergency Department Provider Note   I have reviewed the triage vital signs and the nursing notes.   HISTORY  Chief Complaint Headache   HPI Harold Bright is a 42 y.o. 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 Leonie Man and Dr. Zada Finders primarily presents to the emergency department with recurrent headache and some short-term memory issues.  He is accompanied by his wife today.  Of note, he was seen in the ER on 3/15 with headache and had a Noncon head CT at that time which was reassuring.  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   He discussed these results with his neurologist who advised he return to Dr. Zada Finders for follow-up which she has scheduled next week but in the past several days has had intermittent left, posterior headaches and his wife reports that he is struggling with some short-term memory issues.  For example he was asking some repetitive questions on the way to the ER this morning.  His headache is currently not bad after taking a Tylenol which typically relieves his symptoms.  No numbness or weakness. No sudden/severe HA.   Past Medical History:  Diagnosis Date   Hypertension     Review of Systems  Constitutional: No fever/chills Cardiovascular: Denies chest pain. Respiratory: Denies shortness of breath. Gastrointestinal: No abdominal pain.  Musculoskeletal: Negative for back pain. Skin: Negative for rash. Neurological: Positive HA and short term memory issues.   ____________________________________________   PHYSICAL EXAM:  VITAL SIGNS: ED Triage Vitals  Enc Vitals Group     BP 01/04/23 1000 124/84     Pulse Rate 01/04/23 1000 96     Resp 01/04/23 1000 (!) 24     Temp 01/04/23 1000 98.1 F (36.7 C)     Temp Source 01/04/23 1000 Oral     SpO2 01/04/23 1000 100 %     Weight 01/04/23  0958 (!) 315 lb (142.9 kg)     Height 01/04/23 0958 6' (1.829 m)   Constitutional: Alert and oriented. Well appearing and in no acute distress. Eyes: Conjunctivae are normal.  Head: Atraumatic. Nose: No congestion/rhinnorhea. Mouth/Throat: Mucous membranes are moist.  Neck: No stridor. Cardiovascular: Normal rate, regular rhythm. Good peripheral circulation. Grossly normal heart sounds.   Respiratory: Normal respiratory effort.  No retractions. Lungs CTAB. Gastrointestinal: Soft and nontender. No distention.  Musculoskeletal: No lower extremity tenderness nor edema. No gross deformities of extremities. Neurologic:  Normal speech and language. No gross focal neurologic deficits are appreciated.  Skin:  Skin is warm, dry and intact. No rash noted.  ____________________________________________   LABS (all labs ordered are listed, but only abnormal results are displayed)  Labs Reviewed  COMPREHENSIVE METABOLIC PANEL - Abnormal; Notable for the following components:      Result Value   Glucose, Bld 110 (*)    All other components within normal limits  CBC WITH DIFFERENTIAL/PLATELET   ____________________________________________  RADIOLOGY  CT ANGIO HEAD NECK W WO CM  Result Date: 01/04/2023 CLINICAL DATA:  AVM/AVF, high-flow vascular malformation EXAM: CT ANGIOGRAPHY HEAD AND NECK TECHNIQUE: Multidetector CT imaging of the head and neck was performed using the standard protocol during bolus administration of intravenous contrast. Multiplanar CT image reconstructions and MIPs were obtained to evaluate the vascular anatomy. Carotid stenosis measurements (when applicable) are obtained utilizing NASCET criteria, using the distal internal carotid diameter as the denominator. RADIATION  DOSE REDUCTION: This exam was performed according to the departmental dose-optimization program which includes automated exposure control, adjustment of the mA and/or kV according to patient size and/or use of  iterative reconstruction technique. CONTRAST:  6mL OMNIPAQUE IOHEXOL 350 MG/ML SOLN COMPARISON:  CT head December 29, 2022.  CTA head/neck Mar 09, 2021. FINDINGS: CT HEAD FINDINGS Brain: Redemonstrated hypoattenuation in in the splenium of the corpus callosum as well as overlying frontoparietal white matter. No evidence of acute hemorrhage, midline shift or hydrocephalus. Vascular: See below. Skull: No acute fracture. Sinuses/Orbits: Clear sinuses.  No acute orbital findings. Other: No mastoid effusions. Review of the MIP images confirms the above findings CTA NECK FINDINGS Aortic arch: Great vessel origins are patent without significant stenosis. Right carotid system: No evidence of dissection, stenosis (50% or greater), or occlusion. Left carotid system: No evidence of dissection, stenosis (50% or greater), or occlusion. Vertebral arteries: Codominant. No evidence of dissection, stenosis (50% or greater), or occlusion. Skeleton: No acute abnormality on limited assessment. Other neck: No acute abnormality on limited assessment. Upper chest: Visualized lung apices are clear. Review of the MIP images confirms the above findings CTA HEAD FINDINGS Anterior circulation: Bilateral intracranial ICAs, MCAs and ACAs are patent without proximal high-grade stenosis. Posterior circulation: Bilateral vertebral arteries, basilar artery and bilateral posterior cerebral res are patent without proximal hemodynamically significant stenosis. Venous sinuses: Major dural venous sinuses are patent. Review of the MIP images confirms the above findings IMPRESSION: 1. In comparison to CTA from Mar 09, 2021, interval nonopacification of many of the small tangle of vessels in the AVM centered in the left aspect of the splenium of the corpus callosum, suggestive of interval partial thrombosis. The main draining vein remains patent and drains into the vein of [place]. The major dural venous sinuses remain patent. 2. In comparison to recent CT head  from December 29, 2022, redemonstrated hypoattenuation in the splenium of the corpus callosum and overlying frontoparietal white matter, possibly mildly progressed. In the context of impression #1, this could represent venous congestion and/or venous infarct. 3. No large vessel arterial occlusion or proximal hemodynamically significant stenosis. Electronically Signed   By: Margaretha Sheffield M.D.   On: 01/04/2023 12:30    ____________________________________________   PROCEDURES  Procedure(s) performed:   Procedures  None  ____________________________________________   INITIAL IMPRESSION / ASSESSMENT AND PLAN / ED COURSE  Pertinent labs & imaging results that were available during my care of the patient were reviewed by me and considered in my medical decision making (see chart for details).   This patient is Presenting for Evaluation of HA, which does require a range of treatment options, and is a complaint that involves a high risk of morbidity and mortality.  The Differential Diagnoses includes but is not exclusive to subarachnoid hemorrhage, meningitis, encephalitis, previous head trauma, cavernous venous thrombosis, muscle tension headache, glaucoma, temporal arteritis, migraine or migraine equivalent, etc.   Critical Interventions-    Medications  sodium chloride 0.9 % bolus 500 mL (0 mLs Intravenous Stopped 01/04/23 1502)  iohexol (OMNIPAQUE) 350 MG/ML injection 75 mL (75 mLs Intravenous Contrast Given 01/04/23 1209)  acetaminophen (TYLENOL) tablet 500 mg (500 mg Oral Given 01/04/23 1327)  dexamethasone (DECADRON) injection 10 mg (10 mg Intravenous Given 01/04/23 1452)    Reassessment after intervention: HA minimal at this time.    I did obtain Additional Historical Information from [place] t bedside.   I decided to review pertinent External Data, and in summary patient with MRI on 12/24/22  and last Neuology note from 12/07/22. Patient with stereotactic radiosurgery with Dr.  Zada Finders on 05/03/21.    Clinical Laboratory Tests Ordered, included BC without leukocytosis or anemia.  No acute kidney injury.  Radiologic Tests Ordered, included CTA head/neck. I independently interpreted the images and agree with radiology interpretation.   Cardiac Monitor Tracing which shows NSR.    Social Determinants of Health Risk patient is a non-smoker.   Consult complete with NSG. Dr. Zada Finders. Plan for decadron 10 mg here and 2mg  PO at home starting tomorrow. He will follow up with patient for steroid dose adjustment and reassessment next week.   Medical Decision Making: Summary:  Patient presents to the emergency department for evaluation of intermittent headaches and some forgetfulness.  Symptoms reminding him of the last time he required AVM surgery in 2022.  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 With progressing symptoms, plan for CTA head/neck and will reach out to [facility] on call as well.   Reevaluation with update and discussion with patient. HA improved. Stable for d/c.   Patient's presentation is most consistent with acute presentation with potential threat to life or bodily function.   Disposition: discharge  ____________________________________________  FINAL CLINICAL IMPRESSION(S) / ED DIAGNOSES  Final diagnoses:  Bad headache     NEW OUTPATIENT MEDICATIONS STARTED DURING THIS VISIT:  Discharge Medication List as of 01/04/2023  2:57 PM     START taking these medications   Details  dexamethasone (DECADRON) 2 MG tablet Take 1 tablet (2 mg total) by mouth daily for 10 days., Starting Fri 01/05/2023, Until Mon 01/15/2023, Normal        Note:  This document was prepared using Dragon voice recognition software and may include unintentional dictation errors.  Nanda Quinton, MD, [facility]    Devonia Farro, [facility], MD 01/04/23 [phone]

## 2023-01-04 NOTE — Discharge Instructions (Signed)

## 2023-01-04 NOTE — Consult Note (Signed)
Neurosurgery Consultation  Reason for Consult: Headache, h/o AVM Referring Physician: Long  CC: Headaches, memory issues  HPI: This is a 42 y.o. 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    ROS: A 14 point ROS was performed and is negative except as noted in the HPI.   PMHx:  Past Medical History:  Diagnosis Date   Hypertension    FamHx: History reviewed. No pertinent family history. SocHx:  reports that he has never smoked. He has never used smokeless tobacco. He reports current alcohol use. He reports that he does not use drugs.  Exam: Vital signs in last 24 hours: Temp:  [98.1 F (36.7 C)] 98.1 F (36.7 C) (03/21 1000) Pulse Rate:  [66-96] 66 (03/21 1345) Resp:  [15-24] 15 (03/21 1345) BP: (114-124)/(68-84) 117/68 (03/21 1345) SpO2:  [100 %] 100 % (03/21 1345) Weight:  [142.9 kg] 142.9 kg (03/21 0958) General: Awake, alert, cooperative, lying in bed in NAD Head: Normocephalic and atruamatic HEENT: Neck supple Pulmonary: breathing room air comfortably, no evidence of increased work of breathing Cardiac: RRR Abdomen: S NT ND Extremities: Warm and well perfused x4 Neuro: AOx3, PERRL, EOMI, FS Strength 5/5 x4, SILTx4 Speech fluent with normal content   Assessment and Plan: 42 y.o. man w/ h/o AVM s/p SRS. CTA personally reviewed, which shows likely still patent AVM but significantly improved from baseline study. Recent MRI reviewed, which shows some bilateral periventricular edema c/w post-treatment changes. 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 Can start steroids with 10mg  IV x1 here then 2mg  daily and see how  he does with it. He already has an appointment with me in one week that he's going to keep, discussed he'll need to return if he has [facility] -please call with any concerns or questions  43/21/24 2:30 PM [facility] 2:30 PM [facility]

## 2023-01-11 ENCOUNTER — Other Ambulatory Visit: Payer: Self-pay | Admitting: Radiation Therapy

## 2023-01-15 ENCOUNTER — Telehealth: Payer: Self-pay | Admitting: Internal Medicine

## 2023-01-15 ENCOUNTER — Inpatient Hospital Stay: Payer: Managed Care, Other (non HMO) | Attending: Neurological Surgery

## 2023-01-15 ENCOUNTER — Other Ambulatory Visit: Payer: Self-pay | Admitting: Radiation Therapy

## 2023-01-15 DIAGNOSIS — I6789 Other cerebrovascular disease: Secondary | ICD-10-CM | POA: Insufficient documentation

## 2023-01-15 DIAGNOSIS — Y842 Radiological procedure and radiotherapy as the cause of abnormal reaction of the patient, or of later complication, without mention of misadventure at the time of the procedure: Secondary | ICD-10-CM | POA: Insufficient documentation

## 2023-01-15 DIAGNOSIS — I10 Essential (primary) hypertension: Secondary | ICD-10-CM | POA: Insufficient documentation

## 2023-01-15 DIAGNOSIS — Q282 Arteriovenous malformation of cerebral vessels: Secondary | ICD-10-CM

## 2023-01-15 NOTE — Telephone Encounter (Signed)
scheduled per 4/1 referral , pt has been called and confirmed date and time. Pt is aware of location and to arrive early for check in

## 2023-01-16 ENCOUNTER — Ambulatory Visit: Payer: Managed Care, Other (non HMO) | Admitting: Neurology

## 2023-01-18 ENCOUNTER — Inpatient Hospital Stay (HOSPITAL_BASED_OUTPATIENT_CLINIC_OR_DEPARTMENT_OTHER): Payer: Managed Care, Other (non HMO) | Admitting: Internal Medicine

## 2023-01-18 ENCOUNTER — Telehealth: Payer: Self-pay | Admitting: Internal Medicine

## 2023-01-18 VITALS — BP 130/92 | HR 100 | Temp 98.4°F | Resp 17 | Wt 316.6 lb

## 2023-01-18 DIAGNOSIS — I6789 Other cerebrovascular disease: Secondary | ICD-10-CM

## 2023-01-18 DIAGNOSIS — I10 Essential (primary) hypertension: Secondary | ICD-10-CM | POA: Diagnosis not present

## 2023-01-18 DIAGNOSIS — Y842 Radiological procedure and radiotherapy as the cause of abnormal reaction of the patient, or of later complication, without mention of misadventure at the time of the procedure: Secondary | ICD-10-CM

## 2023-01-18 MED ORDER — DEXAMETHASONE 1 MG PO TABS: 2.0000 mg | ORAL_TABLET | Freq: Two times a day (BID) | ORAL | 1 refills | Status: AC

## 2023-01-18 NOTE — Progress Notes (Signed)
[facility] [place], [place] [address] [phone]   New Patient Evaluation  Date of Service: 01/18/23 Patient Name: Harold Bright Patient MRN: [id number] Patient DOB: 10/12/1981 Provider: Ventura Sellers, MD  Identifying Statement:  Harold Bright is a 42 y.o. male with Radiation therapy induced brain necrosis who presents for initial consultation and evaluation regarding neurologic deficits.    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 Tammi Klippel   History of Present Illness: The patient's records from the referring physician were obtained and reviewed and the patient interviewed to confirm this HPI.  Harold Bright presents to review recent neurologic symptoms.  He describes new onset headaches and short term memory impairment, starting March 2024.  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  Did experience several severe headaches with pounding semiology, even a visual migraine aura.  CNS imaging demonstrated inflammatory changes, he started on decadron via Dr. Zada Finders and completed 10 day course of 2mg  daily.   Steroids helped considerably with all symptoms.  He has been off decadron for 3 days; he still does not feel back at prior baseline, however.    Medications: Current Outpatient Medications on File Prior to Visit  Medication Sig Dispense Refill   cetirizine (ZYRTEC) 10 MG tablet Take 10 mg by mouth every morning.     fluticasone (FLONASE) 50 MCG/ACT nasal spray Place 1 spray into both nostrils daily.     Multiple Vitamin (MULTIVITAMIN WITH MINERALS) TABS tablet Take 1 tablet by mouth every morning.     olmesartan (BENICAR) 20 MG tablet Take 20 mg by mouth every morning.     Omega-3 Fatty Acids (FISH OIL PO) Take 2 capsules by  mouth every morning.     rosuvastatin (CRESTOR) 10 MG tablet Take 10 mg by mouth at bedtime.     WEGOVY 0.25 MG/0.5ML SOAJ Inject 0.25 mg into the skin once a week.     No current facility-administered medications on file prior to visit.    Allergies:  Allergies  Allergen Reactions   Penicillins Hives   Sulfa Antibiotics Hives   Past Medical History:  Past Medical History:  Diagnosis Date   Hypertension    Past Surgical History:  Past Surgical History:  Procedure Laterality Date   IR ANGIO INTRA EXTRACRAN SEL INTERNAL CAROTID BILAT MOD SED  03/10/2021   IR ANGIO VERTEBRAL SEL VERTEBRAL UNI L MOD SED  03/10/2021   Social History:  Social History   Socioeconomic History   Marital status: Married    Spouse name: [place]   Number of children: Not on file   Years of education: Not on file   Highest education level: Not on file  Occupational History   Not on file  Tobacco Use   Smoking status: Never   Smokeless tobacco: Never  Vaping Use   Vaping Use: Never used  Substance and Sexual Activity   Alcohol use: Yes    Comment: 2 a day   Drug use: Never   Sexual activity: Not on file  Other Topics Concern   Not on file  Social History Narrative   Lives with wife and daughter at home   Right Handed   Drinks 1-2 cups caffeine daily   Social Determinants of Health   Financial Resource Strain: Not on file  Food Insecurity: Not on file  Transportation Needs: Not on file  Physical Activity: Not on file  Stress: Not on file  Social Connections: Not on file  Intimate Partner Violence: Not on file   Family History: No family history on file.  Review of Systems: Constitutional: Doesn't report fevers, chills or abnormal weight loss Eyes: Doesn't report blurriness of vision Ears, nose, mouth, throat, and face: Doesn't report sore throat Respiratory: Doesn't report cough, dyspnea or wheezes Cardiovascular: Doesn't report palpitation, chest discomfort  Gastrointestinal:   Doesn't report nausea, constipation, diarrhea GU: Doesn't report incontinence Skin: Doesn't report skin rashes Neurological: Per HPI Musculoskeletal: Doesn't report joint pain Behavioral/Psych: Doesn't report anxiety  Physical Exam: Vitals:   01/18/23 1125  BP: (!) 130/92  Pulse: 100  Resp: 17  Temp: 98.4 F (36.9 C)  SpO2: 98%   KPS: 90. General: Alert, cooperative, pleasant, in no acute distress Head: Normal EENT: No conjunctival injection or scleral icterus.  Lungs: Resp effort normal Cardiac: Regular rate Abdomen: Non-distended abdomen Skin: No rashes cyanosis or petechiae. Extremities: No clubbing or edema  Neurologic Exam: Mental Status: Awake, alert, attentive to examiner. Oriented to self and environment. Language is fluent with intact comprehension.  Cranial Nerves: Visual acuity is grossly normal. Visual fields are full. Extra-ocular movements intact. No ptosis. Face is symmetric Motor: Tone and bulk are normal. Power is full in both arms and legs. Reflexes are symmetric, no pathologic reflexes present.  Sensory: Intact to light touch Gait: Normal.   Labs: I have reviewed the data as listed    Component Value Date/Time   NA 138 01/04/2023 1017   K 4.1 01/04/2023 1017   CL 104 01/04/2023 1017   CO2 26 01/04/2023 1017   GLUCOSE 110 (H) 01/04/2023 1017   BUN 6 01/04/2023 1017   CREATININE 0.99 01/04/2023 1017   CREATININE 0.89 04/12/2021 1007   CALCIUM 9.4 01/04/2023 1017   PROT 7.3 01/04/2023 1017   ALBUMIN 4.3 01/04/2023 1017   AST 25 01/04/2023 1017   ALT 36 01/04/2023 1017   ALKPHOS 63 01/04/2023 1017   BILITOT 0.8 01/04/2023 1017   GFRNONAA >60 01/04/2023 1017   GFRNONAA >60 04/12/2021 1007   Lab Results  Component Value Date   WBC 7.1 01/04/2023   NEUTROABS 4.8 01/04/2023   HGB 13.9 01/04/2023   HCT 41.2 01/04/2023   MCV 89.8 01/04/2023   PLT 265 01/04/2023    Imaging:  CT ANGIO HEAD NECK W WO CM  Result Date: 01/04/2023 CLINICAL  DATA:  AVM/AVF, high-flow vascular malformation EXAM: CT ANGIOGRAPHY HEAD AND NECK TECHNIQUE: Multidetector CT imaging of the head and neck was performed using the standard protocol during bolus administration of intravenous contrast. Multiplanar CT image reconstructions and MIPs were obtained to evaluate the vascular anatomy. Carotid stenosis measurements (when applicable) are obtained utilizing NASCET criteria, using the distal internal carotid diameter as the denominator. RADIATION DOSE REDUCTION: This exam was performed according to the departmental dose-optimization program which includes automated exposure control, adjustment of the mA and/or kV according to patient size and/or use of iterative reconstruction technique. CONTRAST:  43mL OMNIPAQUE IOHEXOL 350 MG/ML SOLN COMPARISON:  CT head December 29, 2022.  CTA head/neck Mar 09, 2021. FINDINGS: CT HEAD FINDINGS Brain: Redemonstrated hypoattenuation in in the splenium of the corpus callosum as well as overlying frontoparietal white matter. No evidence of acute hemorrhage, midline shift or hydrocephalus. Vascular: See below. Skull: No acute fracture. Sinuses/Orbits: Clear sinuses.  No acute orbital findings. Other: No mastoid  effusions. Review of the MIP images confirms the above findings CTA NECK FINDINGS Aortic arch: Great vessel origins are patent without significant stenosis. Right carotid system: No evidence of dissection, stenosis (50% or greater), or occlusion. Left carotid system: No evidence of dissection, stenosis (50% or greater), or occlusion. Vertebral arteries: Codominant. No evidence of dissection, stenosis (50% or greater), or occlusion. Skeleton: No acute abnormality on limited assessment. Other neck: No acute abnormality on limited assessment. Upper chest: Visualized lung apices are clear. Review of the MIP images confirms the above findings CTA HEAD FINDINGS Anterior circulation: Bilateral intracranial ICAs, MCAs and ACAs are patent without  proximal high-grade stenosis. Posterior circulation: Bilateral vertebral arteries, basilar artery and bilateral posterior cerebral res are patent without proximal hemodynamically significant stenosis. Venous sinuses: Major dural venous sinuses are patent. Review of the MIP images confirms the above findings IMPRESSION: 1. In comparison to CTA from Mar 09, 2021, interval nonopacification of many of the small tangle of vessels in the AVM centered in the left aspect of the splenium of the corpus callosum, suggestive of interval partial thrombosis. The main draining vein remains patent and drains into the vein of Galen. The major dural venous sinuses remain patent. 2. In comparison to recent CT head from December 29, 2022, redemonstrated hypoattenuation in the splenium of the corpus callosum and overlying frontoparietal white matter, possibly mildly progressed. In the context of impression #1, this could represent venous congestion and/or venous infarct. 3. No large vessel arterial occlusion or proximal hemodynamically significant stenosis. Electronically Signed   By: Margaretha Sheffield M.D.   On: 01/04/2023 12:30   CT HEAD WO CONTRAST  Result Date: 12/29/2022 CLINICAL DATA:  Sudden severe headache EXAM: CT HEAD WITHOUT CONTRAST TECHNIQUE: Contiguous axial images were obtained from the base of the skull through the vertex without intravenous contrast. RADIATION DOSE REDUCTION: This exam was performed according to the departmental dose-optimization program which includes automated exposure control, adjustment of the mA and/or kV according to patient size and/or use of iterative reconstruction technique. COMPARISON:  Brain MRI 12/24/2022. FINDINGS: Brain: No acute hemorrhage. No extra-axial collection. Hypoattenuation within the white matter of both posterior hemispheres, involving the splenium of the corpus callosum and corresponding to the vascular lesion seen on recent brain MRI. Vascular: No hyperdense vessel or  unexpected calcification. Skull: Normal. Negative for fracture or focal lesion. Sinuses/Orbits: No acute finding. Other: None. IMPRESSION: 1. No acute intracranial hemorrhage. 2. Hypoattenuation within the white matter at the site of known splenium AVM. Electronically Signed   By: Ulyses Jarred M.D.   On: 12/29/2022 03:21   MR BRAIN W WO CONTRAST  Result Date: 12/24/2022  [facility] [address] [place], [place] [address] [phone] NEUROIMAGING REPORT STUDY DATE: 12/24/2022 PATIENT NAME: Kentrez Ciaramella DOB: [birth year] MRN: [id number] ORDERING CLINICIAN: Dr Leonie Man CLINICAL HISTORY:  41 year patient with AVM COMPARISON FILMS:  MRI Brain w/wo 05/21/2022 EXAM: MRI Brain w/wo TECHNIQUE: sagittal T , axial T 1, T 2, FLAIR, DWI, ADC map, SWI, Coronal T 2 and [pst contrast axial and coronal T 1 images were obtained thru the brain. CONTRAST:  10 ml iv gadopiclenol IMAGING SITE:  [facility] FINDINGS: The brain parenchyma shows splenial AV malformation with increased size compared to the previous scan now measuring about 2.3 x 2.8 cm though it is difficult to have exact measure but this time it seems to extend to the right of the midline the most of the AVM is to the left.  There is similar cytotoxic edema in bilateral periatrial regions.  Postcontrast images show typical flow-voids of AVM with a more variegated appearance compared to the previous scan. No structural lesion, tumor or infarcts are noted.  Diffusion-weighted imaging is negative for acute ischemia.  SWI images show flow-voids in the AVM.  Subarachnoid spaces and ventricular system appear normal.  Cortical sulci and gyri show normal appearance.  Extra-axial brain structures appear normal.  Calvarium shows no abnormalities.  Orbits appear unremarkable.  Paranasal sinuses show minor mucosal thickening.  The pituitary gland and cerebellar tonsils appear normal.  Flow-voids of large vessels of intracranial circulation  appear to be patent.  Visualized portion of the cervical spine shows no abnormalities.   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 INTERPRETING PHYSICIAN: Antony Contras, MD Certified in  Neuroimaging by [employer] and [employer]   [facility] Interpretation: I have personally reviewed the radiological images as listed.  My interpretation, in the context of the patient's clinical presentation, is likely treatment effect   Assessment/Plan Radiation therapy induced brain necrosis  Zymeer Hewitt presents with clinical and radiographic syndrome c/w post-SRS radio-inflammatory change.  Degree of inflammation was significant on last month's MRI.    He still describes some subjective short term memory impairment compared to prior; we can likely attribute this to ongoing inflammation.    We recommended resuming decadron at 4mg  daily, and decreasing by 1mg  every 5-7 days if tolerated.    We spent twenty additional minutes teaching regarding the natural history, biology, and historical experience in the treatment of neurologic complications.   We appreciate the opportunity to participate in the care of Mustaf Foutch.  We will give him a call in 1 month to assess and continue decadron taper.  All questions were answered. The patient knows to call the clinic with any problems, questions or concerns. No barriers to learning were detected.  The total time spent in the encounter was 45 minutes and more than 50% was on counseling and review of test results   Ventura Sellers, MD Medical Director of Neuro-Oncology [facility] [place] [facility] 01/18/23 11:35 AM

## 2023-01-18 NOTE — Telephone Encounter (Signed)
Reached out to patient to schedule , patient aware of date and time of appointment.

## 2023-01-24 ENCOUNTER — Other Ambulatory Visit: Payer: Self-pay | Admitting: Neurosurgery

## 2023-01-24 DIAGNOSIS — Q2730 Arteriovenous malformation, site unspecified: Secondary | ICD-10-CM

## 2023-02-06 ENCOUNTER — Other Ambulatory Visit: Payer: Self-pay | Admitting: Neurosurgery

## 2023-02-06 ENCOUNTER — Other Ambulatory Visit: Payer: Self-pay

## 2023-02-06 ENCOUNTER — Ambulatory Visit (HOSPITAL_COMMUNITY)
Admission: RE | Admit: 2023-02-06 | Discharge: 2023-02-06 | Disposition: A | Discharge status: Home / Self Care | Payer: Managed Care, Other (non HMO) | Source: Ambulatory Visit | Attending: Neurosurgery | Admitting: Neurosurgery

## 2023-02-06 DIAGNOSIS — Q2730 Arteriovenous malformation, site unspecified: Secondary | ICD-10-CM

## 2023-02-06 HISTORY — PX: IR ANGIO VERTEBRAL SEL VERTEBRAL UNI L MOD SED: IMG5367

## 2023-02-06 HISTORY — PX: IR ANGIO INTRA EXTRACRAN SEL INTERNAL CAROTID BILAT MOD SED: IMG5363

## 2023-02-06 LAB — PROTIME-INR
INR: 0.9 (ref 0.8–1.2)
Prothrombin Time: 12.3 seconds (ref 11.4–15.2)

## 2023-02-06 LAB — BASIC METABOLIC PANEL
Anion gap: 9 (ref 5–15)
BUN: 14 mg/dL (ref 6–20)
CO2: 29 mmol/L (ref 22–32)
Calcium: 9.2 mg/dL (ref 8.9–10.3)
Chloride: 102 mmol/L (ref 98–111)
Creatinine, Ser: 0.98 mg/dL (ref 0.61–1.24)
GFR, Estimated: >60 mL/min (ref >60)
Glucose, Bld: 104 mg/dL — ABNORMAL HIGH (ref 70–99)
Potassium: 4.9 mmol/L (ref 3.5–5.1)
Sodium: 140 mmol/L (ref 135–145)

## 2023-02-06 LAB — CBC WITH DIFFERENTIAL/PLATELET
Abs Immature Granulocytes: 0.07 10*3/uL (ref 0.00–0.07)
Basophils Absolute: 0 10*3/uL (ref 0.0–0.1)
Basophils Relative: 0 %
Eosinophils Absolute: 0.1 10*3/uL (ref 0.0–0.5)
Eosinophils Relative: 1 %
HCT: 42.7 % (ref 39.0–52.0)
Hemoglobin: 14 g/dL (ref 13.0–17.0)
Immature Granulocytes: 1 %
Lymphocytes Relative: 17 %
Lymphs Abs: 1.7 10*3/uL (ref 0.7–4.0)
MCH: 30.6 pg (ref 26.0–34.0)
MCHC: 32.8 g/dL (ref 30.0–36.0)
MCV: 93.4 fL (ref 80.0–100.0)
Monocytes Absolute: 0.6 10*3/uL (ref 0.1–1.0)
Monocytes Relative: 6 %
Neutro Abs: 7.7 10*3/uL (ref 1.7–7.7)
Neutrophils Relative %: 75 %
Platelets: 272 10*3/uL (ref 150–400)
RBC: 4.57 MIL/uL (ref 4.22–5.81)
RDW: 15.1 % (ref 11.5–15.5)
WBC: 10.2 10*3/uL (ref 4.0–10.5)
nRBC: 0.2 % (ref 0.0–0.2)

## 2023-02-06 LAB — APTT: aPTT: 26 seconds (ref 24–36)

## 2023-02-06 MED ORDER — CHLORHEXIDINE GLUCONATE CLOTH 2 % EX PADS: 6.0000 | MEDICATED_PAD | Freq: Once | CUTANEOUS | Status: DC

## 2023-02-06 MED ORDER — SODIUM CHLORIDE 0.9 % IV SOLN
INTRAVENOUS | Status: AC | PRN
  Administered 2023-02-06: 10 mL/h via INTRAVENOUS

## 2023-02-06 MED ORDER — MIDAZOLAM HCL 2 MG/2ML IJ SOLN
INTRAMUSCULAR | Status: AC
  Filled 2023-02-06: qty 2

## 2023-02-06 MED ORDER — HEPARIN SODIUM (PORCINE) 1000 UNIT/ML IJ SOLN
INTRAMUSCULAR | Status: AC
  Filled 2023-02-06: qty 10

## 2023-02-06 MED ORDER — FENTANYL CITRATE (PF) 100 MCG/2ML IJ SOLN
INTRAMUSCULAR | Status: AC
  Filled 2023-02-06: qty 2

## 2023-02-06 MED ORDER — LIDOCAINE HCL 1 % IJ SOLN
INTRAMUSCULAR | Status: AC
  Filled 2023-02-06: qty 20

## 2023-02-06 MED ORDER — SODIUM CHLORIDE 0.9 % IV SOLN: INTRAVENOUS | Status: DC

## 2023-02-06 MED ORDER — HEPARIN SODIUM (PORCINE) 1000 UNIT/ML IJ SOLN
INTRAMUSCULAR | Status: AC | PRN
  Administered 2023-02-06: 2000 [IU] via INTRAVENOUS

## 2023-02-06 MED ORDER — FENTANYL CITRATE (PF) 100 MCG/2ML IJ SOLN
INTRAMUSCULAR | Status: AC | PRN
  Administered 2023-02-06: 25 ug via INTRAVENOUS
  Administered 2023-02-06: 50 ug via INTRAVENOUS

## 2023-02-06 MED ORDER — HYDROCODONE-ACETAMINOPHEN 5-325 MG PO TABS: 1.0000 | ORAL_TABLET | ORAL | Status: DC | PRN

## 2023-02-06 MED ORDER — IOHEXOL 300 MG/ML  SOLN
100.0000 mL | Freq: Once | INTRAMUSCULAR | Status: AC | PRN
  Administered 2023-02-06: 30 mL via INTRA_ARTERIAL

## 2023-02-06 MED ORDER — VANCOMYCIN HCL IN DEXTROSE 1-5 GM/200ML-% IV SOLN: 1000.0000 mg | INTRAVENOUS | Status: DC

## 2023-02-06 MED ORDER — MIDAZOLAM HCL 2 MG/2ML IJ SOLN
INTRAMUSCULAR | Status: AC | PRN
  Administered 2023-02-06 (×2): 1 mg via INTRAVENOUS

## 2023-02-06 NOTE — Op Note (Signed)
  NEUROSURGERY BRIEF OPERATIVE  NOTE   PREOP DX: Splenial AVM  POSTOP DX: Same  PROCEDURE: Diagnostic cerebral angiogram  SURGEON: Dr. Lisbeth Renshaw, MD  ANESTHESIA: IV Sedation with Local  APPROACH: Right trans-femoral  EBL: Minimal  SPECIMENS: None  COMPLICATIONS: None  CONDITION: Stable to recovery  FINDINGS (Full report in [facility]): 1. Complete obliteration of previously described splenial AVM nearly 68yrs after [facility] treatment.   Lisbeth Renshaw, MD [facility]

## 2023-02-06 NOTE — H&P (Signed)
  Chief Complaint   AVM  History of Present Illness  Harold Bright is a 42 y.o. 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  He went stereotactic radiosurgery and has made a good neurologic recovery.  He presents today for posttreatment angiogram.  Past Medical History   Past Medical History:  Diagnosis Date   Hypertension     Past Surgical History   Past Surgical History:  Procedure Laterality Date   IR ANGIO INTRA EXTRACRAN SEL INTERNAL CAROTID BILAT MOD SED  03/10/2021   IR ANGIO VERTEBRAL SEL VERTEBRAL UNI L MOD SED  03/10/2021    Social History   Social History   Tobacco Use   Smoking status: Never   Smokeless tobacco: Never  Vaping Use   Vaping Use: Never used  Substance Use Topics   Alcohol use: Yes    Comment: 2 a day   Drug use: Never    Medications   Prior to Admission medications   Medication Sig Start Date End Date Taking? Authorizing Provider  cetirizine (ZYRTEC) 10 MG tablet Take 10 mg by mouth every morning.   Yes [provider]  dexamethasone (DECADRON) 1 MG tablet Take 2 tablets (2 mg total) by mouth 2 (two) times daily with a meal. 01/18/23  Yes Vaslow, Georgeanna Lea, MD  fluticasone (FLONASE) 50 MCG/ACT nasal spray Place 1 spray into both nostrils daily.   Yes [provider]  Multiple Vitamin (MULTIVITAMIN WITH MINERALS) TABS tablet Take 1 tablet by mouth every morning.   Yes [provider]  olmesartan (BENICAR) 20 MG tablet Take 20 mg by mouth every morning. 12/07/20  Yes [provider]  rosuvastatin (CRESTOR) 10 MG tablet Take 10 mg by mouth at bedtime. 02/15/21  Yes [provider]  WEGOVY 0.25 MG/0.5ML SOAJ Inject 0.25 mg into the skin once a week. 10/29/22  Yes [provider]  Omega-3 Fatty Acids (FISH OIL PO) Take 2 capsules by mouth every morning.     [provider]    Allergies   Allergies  Allergen Reactions   Penicillins Hives   Sulfa Antibiotics Hives    Review of Systems  ROS  Neurologic Exam  Awake, alert, oriented Memory and concentration grossly intact Speech fluent, appropriate CN grossly intact Motor exam: Upper Extremities Deltoid Bicep Tricep Grip  Right 5/5 5/5 5/5 5/5  Left 5/5 5/5 5/5 5/5   Lower Extremities IP Quad PF DF EHL  Right 5/5 5/5 5/5 5/5 5/5  Left 5/5 5/5 5/5 5/5 5/5   Sensation grossly intact to LT  Impression  - 43 y.o. male nearly 2 years status post stereotactic radiosurgery treatment of a left splenial Spetzler Martin grade 3 arteriovenous malformation, clinically well.  Plan  -We will proceed with follow-up diagnostic cerebral angiogram  I have reviewed the indications for the procedure as well as the details of the procedure and the expected postoperative course and recovery at length with the patient. We have also reviewed in detail the risks, benefits, and alternatives to the procedure. All questions were answered and Harold Bright provided informed consent to proceed.  Lisbeth Renshaw, MD [facility]

## 2023-02-06 NOTE — Sedation Documentation (Signed)
Patient is resting comfortably. 

## 2023-02-15 ENCOUNTER — Inpatient Hospital Stay: Payer: Managed Care, Other (non HMO) | Attending: Neurological Surgery | Admitting: Internal Medicine

## 2023-02-15 DIAGNOSIS — I6789 Other cerebrovascular disease: Secondary | ICD-10-CM | POA: Diagnosis not present

## 2023-02-15 DIAGNOSIS — Y842 Radiological procedure and radiotherapy as the cause of abnormal reaction of the patient, or of later complication, without mention of misadventure at the time of the procedure: Secondary | ICD-10-CM | POA: Diagnosis not present

## 2023-02-15 NOTE — Progress Notes (Signed)
I connected with Harold Bright on 02/15/23 at  9:00 AM EDT by telephone visit and verified that I am speaking with the correct person using two identifiers.  I discussed the limitations, risks, security and privacy concerns of performing an evaluation and management service by telemedicine and the availability of in-person appointments. I also discussed with the patient that there may be a patient responsible charge related to this service. The patient expressed understanding and agreed to proceed.  Other persons participating in the visit and their role in the encounter:  n/a  Patient's location:  Home Provider's location:  Office Chief Complaint:  Radiation therapy induced brain necrosis  History of Present Ilness: Harold Bright reports improvement in cognitive complaints since restarting the decadron.  He is currently dosing 1.5mg  daily.  On higher doses he experienced stomach discomfort, insomnia and some agitation, which is improved on present dose.  No other new or progressive symptoms, recently comlpleted angiogram with Dr. Conchita Paris.  Observations: Language and cognition at baseline  Assessment and Plan: Radiation therapy induced brain necrosis  Recommended continued gradual taper of decadron, can decrease by 0.5mg  weekly until discontinued, if tolerated.  Follow Up Instructions:  RTC as needed  I discussed the assessment and treatment plan with the patient.  The patient was provided an opportunity to ask questions and all were answered.  The patient agreed with the plan and demonstrated understanding of the instructions.    The patient was advised to call back or seek an in-person evaluation if the symptoms worsen or if the condition fails to improve as anticipated.    Henreitta Leber, MD   I provided 18 minutes of non face-to-face telephone visit time during this encounter, and > 50% was spent counseling as documented under my assessment & plan.

## 2024-02-03 ENCOUNTER — Encounter (HOSPITAL_COMMUNITY): Payer: Self-pay | Admitting: Emergency Medicine

## 2024-02-03 ENCOUNTER — Emergency Department (HOSPITAL_COMMUNITY)
Admission: EM | Admit: 2024-02-03 | Discharge: 2024-02-03 | Disposition: A | Discharge status: Home / Self Care | Attending: Emergency Medicine | Admitting: Emergency Medicine

## 2024-02-03 ENCOUNTER — Other Ambulatory Visit: Payer: Self-pay

## 2024-02-03 ENCOUNTER — Emergency Department (HOSPITAL_COMMUNITY)

## 2024-02-03 DIAGNOSIS — N451 Epididymitis: Secondary | ICD-10-CM | POA: Insufficient documentation

## 2024-02-03 DIAGNOSIS — N433 Hydrocele, unspecified: Secondary | ICD-10-CM | POA: Insufficient documentation

## 2024-02-03 DIAGNOSIS — R1031 Right lower quadrant pain: Secondary | ICD-10-CM | POA: Diagnosis present

## 2024-02-03 LAB — CBC
HCT: 39.9 % (ref 39.0–52.0)
Hemoglobin: 13.3 g/dL (ref 13.0–17.0)
MCH: 30.7 pg (ref 26.0–34.0)
MCHC: 33.3 g/dL (ref 30.0–36.0)
MCV: 92.1 fL (ref 80.0–100.0)
Platelets: 318 10*3/uL (ref 150–400)
RBC: 4.33 MIL/uL (ref 4.22–5.81)
RDW: 12.6 % (ref 11.5–15.5)
WBC: 9.9 10*3/uL (ref 4.0–10.5)
nRBC: 0 % (ref 0.0–0.2)

## 2024-02-03 LAB — COMPREHENSIVE METABOLIC PANEL WITH GFR
ALT: 28 U/L (ref 0–44)
AST: 19 U/L (ref 15–41)
Albumin: 3.5 g/dL (ref 3.5–5.0)
Alkaline Phosphatase: 65 U/L (ref 38–126)
Anion gap: 9 (ref 5–15)
BUN: 14 mg/dL (ref 6–20)
CO2: 23 mmol/L (ref 22–32)
Calcium: 8.6 mg/dL — ABNORMAL LOW (ref 8.9–10.3)
Chloride: 105 mmol/L (ref 98–111)
Creatinine, Ser: 1.03 mg/dL (ref 0.61–1.24)
GFR, Estimated: >60 mL/min (ref >60)
Glucose, Bld: 122 mg/dL — ABNORMAL HIGH (ref 70–99)
Potassium: 3.6 mmol/L (ref 3.5–5.1)
Sodium: 137 mmol/L (ref 135–145)
Total Bilirubin: 0.4 mg/dL (ref 0.0–1.2)
Total Protein: 6.7 g/dL (ref 6.5–8.1)

## 2024-02-03 LAB — URINALYSIS, ROUTINE W REFLEX MICROSCOPIC
Bacteria, UA: NONE SEEN
Bilirubin Urine: NEGATIVE
Glucose, UA: NEGATIVE mg/dL
Hgb urine dipstick: NEGATIVE
Ketones, ur: NEGATIVE mg/dL
Leukocytes,Ua: NEGATIVE
Nitrite: NEGATIVE
Protein, ur: NEGATIVE mg/dL
Specific Gravity, Urine: 1.039 — ABNORMAL HIGH (ref 1.005–1.030)
pH: 5 (ref 5.0–8.0)

## 2024-02-03 LAB — LIPASE, BLOOD: Lipase: 25 U/L (ref 11–51)

## 2024-02-03 MED ORDER — IOHEXOL 300 MG/ML  SOLN
100.0000 mL | Freq: Once | INTRAMUSCULAR | Status: AC | PRN
  Administered 2024-02-03: 100 mL via INTRAVENOUS

## 2024-02-03 MED ORDER — KETOROLAC TROMETHAMINE 15 MG/ML IJ SOLN
15.0000 mg | Freq: Once | INTRAMUSCULAR | Status: AC
  Administered 2024-02-03: 15 mg via INTRAVENOUS
  Filled 2024-02-03: qty 1

## 2024-02-03 MED ORDER — SODIUM CHLORIDE 0.9 % IV BOLUS
1000.0000 mL | Freq: Once | INTRAVENOUS | Status: AC
  Administered 2024-02-03: 1000 mL via INTRAVENOUS

## 2024-02-03 MED ORDER — DOXYCYCLINE HYCLATE 100 MG PO TABS
100.0000 mg | ORAL_TABLET | Freq: Once | ORAL | Status: AC
  Administered 2024-02-03: 100 mg via ORAL
  Filled 2024-02-03: qty 1

## 2024-02-03 MED ORDER — HYDROMORPHONE HCL 1 MG/ML IJ SOLN
1.0000 mg | Freq: Once | INTRAMUSCULAR | Status: AC
  Administered 2024-02-03: 1 mg via INTRAVENOUS
  Filled 2024-02-03: qty 1

## 2024-02-03 MED ORDER — DOXYCYCLINE HYCLATE 100 MG PO CAPS: 100.0000 mg | ORAL_CAPSULE | Freq: Two times a day (BID) | ORAL | 0 refills | Status: AC

## 2024-02-03 MED ORDER — IOHEXOL 350 MG/ML SOLN: 100.0000 mL | Freq: Once | INTRAVENOUS | Status: DC | PRN

## 2024-02-03 NOTE — Discharge Instructions (Addendum)
 You are putting on 2 weeks of antibiotics.  If you are allowed to take NSAIDs, take scheduled ibuprofen or Advil over the next 2 weeks.  Follow-up with your urologist in about a week if not improving.  If you develop new or worsening abdominal pain, testicular pain, swelling, vomiting, fever, or any other new/concerning symptoms and return to the ER.

## 2024-02-03 NOTE — ED Provider Notes (Signed)
 [place] EMERGENCY DEPARTMENT AT [facility] Provider Note   CSN: [id number] Arrival date & time: 02/03/24  1705     History  Chief Complaint  Patient presents with   Abdominal Pain    Harold Bright is a 43 y.o. male.  HPI 43 year old male presents with right lower abdominal pain.  Symptoms started earlier today.  It was not sudden onset and has gradually worsened.  He denies any fevers or vomiting or nausea.  He did get lightheaded and feeling his got a pass out when he sat up.  He denies any chest pain or headache.  The abdominal pain is currently about a 6 out of 10.  He had a vasectomy 2 weeks ago and has had more soreness in the right testicle than the left but does not feel like he had an acute worsening of the pain in the testicle tonight.  No dysuria or hematuria.  Home Medications Prior to Admission medications   Medication Sig Start Date End Date Taking? Authorizing Provider  buPROPion (WELLBUTRIN XL) 150 MG 24 hr tablet Take 150 mg by mouth every morning. 11/06/23  Yes [provider]  cetirizine (ZYRTEC) 10 MG tablet Take 10 mg by mouth every morning.   Yes [provider]  doxycycline  (VIBRAMYCIN ) 100 MG capsule Take 1 capsule (100 mg total) by mouth 2 (two) times daily. One po bid x 7 days 02/03/24  Yes Jerilynn Montenegro, MD  fluticasone  (FLONASE ) 50 MCG/ACT nasal spray Place 1 spray into both nostrils daily.   Yes [provider]  Multiple Vitamin (MULTIVITAMIN WITH MINERALS) TABS tablet Take 1 tablet by mouth every morning.   Yes [provider]  olmesartan (BENICAR) 20 MG tablet Take 20 mg by mouth every morning. 12/07/20  Yes [provider]  Omega-3 Fatty Acids (FISH OIL PO) Take 2 capsules by mouth every morning.   Yes [provider]  rosuvastatin  (CRESTOR ) 10 MG tablet Take 10 mg by mouth at bedtime. 02/15/21  Yes [provider]  cephALEXin (KEFLEX) 500 MG capsule Take 500 mg by mouth  2 (two) times daily. Patient not taking: Reported on 02/03/2024 12/20/23   [provider]  dexamethasone  (DECADRON ) 1 MG tablet Take 2 tablets (2 mg total) by mouth 2 (two) times daily with a meal. Patient not taking: Reported on 02/03/2024 01/18/23   Vaslow, Zachary K, MD  diazepam (VALIUM) 5 MG tablet Take 10 mg by mouth. Patient not taking: Reported on 02/03/2024 12/20/23   [provider]  oxyCODONE -acetaminophen  (PERCOCET/ROXICET) 5-325 MG tablet Take 1 tablet by mouth every 4 (four) hours as needed. Patient not taking: Reported on 02/03/2024 12/20/23   [provider]  WEGOVY 0.25 MG/0.5ML SOAJ Inject 0.25 mg into the skin once a week. Patient not taking: Reported on 02/03/2024 10/29/22   [provider]      Allergies    Penicillins and Sulfa antibiotics    Review of Systems   Review of Systems  Gastrointestinal:  Positive for abdominal pain. Negative for nausea and vomiting.  Genitourinary:  Negative for dysuria, hematuria and scrotal swelling.  Musculoskeletal:  Negative for back pain.  Neurological:  Positive for light-headedness.    Physical Exam Updated Vital Signs BP 116/63   Pulse (!) 55   Temp 98.3 F (36.8 C) (Oral)   Resp 12   SpO2 98%  Physical Exam Vitals and nursing note reviewed. Exam conducted with a chaperone present.  Constitutional:  General: He is not in acute distress.    Appearance: He is well-developed. He is obese. He is not ill-appearing or diaphoretic.  HENT:     Head: Normocephalic and atraumatic.  Cardiovascular:     Rate and Rhythm: Normal rate and regular rhythm.     Heart sounds: Normal heart sounds.  Pulmonary:     Effort: Pulmonary effort is normal.     Breath sounds: Normal breath sounds.  Abdominal:     Palpations: Abdomen is soft.     Tenderness: There is abdominal tenderness in the right lower quadrant. There is no right CVA tenderness.     Comments: No rash  Genitourinary:    Comments: No scrotal  swelling. Mild right sided tenderness. No erythema Skin:    General: Skin is warm and dry.  Neurological:     Mental Status: He is alert.     ED Results / Procedures / Treatments   Labs (all labs ordered are listed, but only abnormal results are displayed) Labs Reviewed  COMPREHENSIVE METABOLIC PANEL WITH GFR - Abnormal; Notable for the following components:      Result Value   Glucose, Bld 122 (*)    Calcium  8.6 (*)    All other components within normal limits  URINALYSIS, ROUTINE W REFLEX MICROSCOPIC - Abnormal; Notable for the following components:   Specific Gravity, Urine 1.039 (*)    All other components within normal limits  LIPASE, BLOOD  CBC    EKG EKG Interpretation Date/Time:  Sunday February 03 2024 18:34:40 EDT Ventricular Rate:  55 PR Interval:  152 QRS Duration:  89 QT Interval:  394 QTC Calculation: 377 R Axis:   19  Text Interpretation: Sinus rhythm no acute ST/T changes HR is slower, but otherwise similar to Mar 2024 Confirmed by Jerilynn Montenegro [phone]) on 02/03/2024 8:59:36 PM  Radiology US  SCROTUM W/DOPPLER Result Date: 02/03/2024 CLINICAL DATA:  Right-sided scrotal pain x1 day. EXAM: SCROTAL ULTRASOUND DOPPLER ULTRASOUND OF THE TESTICLES TECHNIQUE: Complete ultrasound examination of the testicles, epididymis, and other scrotal structures was performed. Color and spectral Doppler ultrasound were also utilized to evaluate blood flow to the testicles. COMPARISON:  None Available. FINDINGS: Right testicle Measurements: 3.6 cm x 2.7 cm x 2.9 cm. A 0.5 cm x 0.5 cm x 0.6 cm intra testicular cyst is noted. No microlithiasis visualized. Left testicle Measurements: 3.8 cm x 2.7 cm x 2.4 cm. No mass or microlithiasis visualized. Right epididymis: The tail of the right epididymis is enlarged, heterogeneous and hypervascular on color Doppler evaluation. Left epididymis: A 0.4 cm x 0.3 cm x 0.4 cm simple cyst is seen within the head of the left epididymis. Hydrocele:  There  is a small right-sided hydrocele. Varicocele:  None visualized. Pulsed Doppler interrogation of both testes demonstrates normal low resistance arterial and venous waveforms bilaterally. IMPRESSION: 1. Findings consistent with right-sided epididymitis. 2. Small right-sided hydrocele. Electronically Signed   By: Virgle Grime M.D.   On: 02/03/2024 22:26   CT ABDOMEN PELVIS W CONTRAST Result Date: 02/03/2024 CLINICAL DATA:  Right lower quadrant abdominal pain. Vasectomy 2 weeks ago. EXAM: CT ABDOMEN AND PELVIS WITH CONTRAST TECHNIQUE: Multidetector CT imaging of the abdomen and pelvis was performed using the standard protocol following bolus administration of intravenous contrast. RADIATION DOSE REDUCTION: This exam was performed according to the departmental dose-optimization program which includes automated exposure control, adjustment of the mA and/or kV according to patient size and/or use of iterative reconstruction technique. CONTRAST:  OMNIPAQUE  IOHEXOL  300  MG/ML  SOLN COMPARISON:  None Available. FINDINGS: Lower chest: No acute abnormality. Hepatobiliary: No focal liver abnormality is seen. Fatty infiltration of the liver is noted. No gallstones, gallbladder wall thickening, or biliary dilatation. Pancreas: Unremarkable. No pancreatic ductal dilatation or surrounding inflammatory changes. Spleen: Normal in size without focal abnormality. Adrenals/Urinary Tract: No adrenal nodule or mass. The kidneys enhance symmetrically. Subcentimeter hypodensities are noted in the left kidney which are too small to further characterize. No renal or ureteral calculus or hydronephrosis bilaterally. The bladder is unremarkable. Stomach/Bowel: Stomach is within normal limits. Appendix appears normal. No evidence of bowel wall thickening, distention, or inflammatory changes. No free air or pneumatosis is seen. Vascular/Lymphatic: No significant vascular findings are present. No enlarged abdominal or pelvic lymph  nodes. Reproductive: Prostate is unremarkable. Other: No abdominopelvic ascites. Musculoskeletal: No acute osseous abnormality. IMPRESSION: 1. No acute intra-abdominal process. 2. Hepatic steatosis. Electronically Signed   By: Wyvonnia Heimlich M.D.   On: 02/03/2024 20:51    Procedures Procedures    Medications Ordered in ED Medications  doxycycline  (VIBRA -TABS) tablet 100 mg (has no administration in time range)  HYDROmorphone  (DILAUDID ) injection 1 mg (1 mg Intravenous Given 02/03/24 1855)  sodium chloride  0.9 % bolus 1,000 mL (0 mLs Intravenous Stopped 02/03/24 2259)  iohexol  (OMNIPAQUE ) 300 MG/ML solution 100 mL (100 mLs Intravenous Contrast Given 02/03/24 1956)  ketorolac  (TORADOL ) 15 MG/ML injection 15 mg (15 mg Intravenous Given 02/03/24 2221)    ED Course/ Medical Decision Making/ A&P                                 Medical Decision Making Amount and/or Complexity of Data Reviewed Labs: ordered.    Details: Normal WBC.  Unremarkable urine. Radiology: ordered and independent interpretation performed.    Details: No appendicitis.  Right-sided hydrocele. ECG/medicine tests: ordered and independent interpretation performed.    Details: Mild sinus bradycardia, no ischemia  Risk Prescription drug management.   I suspect patient's right-sided lower abdominal pain is coming from his scrotum.  His CT is overall unremarkable including no appendicitis or ureteral stone.  He does have some discomfort in his right scrotum though no obvious cellulitis.  Ultrasound shows hydrocele as well as epididymitis.  Discussed with Dr. Jarvis Mesa of urology, this is a little atypical almost 3 weeks out from his surgery.  He recommends 2 weeks of doxycycline  as well as NSAIDs.  Discussed with patient, he has had prior AVM treatment but states that he thinks he was cleared to do NSAIDs and that his AVM had resolved.  His pain is otherwise better and he feels well enough for discharge.  Will have him follow-up with  urology if not improving in a week or so.  Given return precautions.        Final Clinical Impression(s) / ED Diagnoses Final diagnoses:  Epididymitis    Rx / DC Orders ED Discharge Orders          Ordered    doxycycline  (VIBRAMYCIN ) 100 MG capsule  2 times daily        02/03/24 2256              Jerilynn Montenegro, MD 02/03/24 2305

## 2024-02-03 NOTE — ED Notes (Signed)
 Patient d/c with home care instructions. With family at bedside. IV discontinued.

## 2024-02-03 NOTE — ED Provider Triage Note (Signed)
 Emergency Medicine Provider Triage Evaluation Note  Harold Bright , a 43 y.o. male  was evaluated in triage.  Pt complains of sudden onset right lower quadrant abdominal pain.  Started today.  Felt lightheaded, diaphoretic like he was in a pass out however did not have any syncope.  No headache.  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  He did have vasectomy about 2 weeks ago and has had some ongoing pain and swelling to his scrotum which is unchanged.  He denies any dysuria or hematuria.  Still has his appendix.  No history of kidney stones  Review of Systems  Positive: Right lower quadrant abdominal pain, lightheadedness Negative:   Physical Exam  BP 122/72 (BP Location: Right Arm)   Pulse (!) 55   Temp 98.3 F (36.8 C) (Oral)   Resp 17   SpO2 98%  Gen:   Awake, no distress   Resp:  Normal effort  MSK:   Moves extremities without difficulty  Other:    Medical Decision Making  Medically screening exam initiated at 5:19 PM.  Appropriate orders placed.  Giulio Bertino was informed that the remainder of the evaluation will be completed by another provider, this initial triage assessment does not replace that evaluation, and the importance of remaining in the ED until their evaluation is complete.  Rlq pain, lightheadedness   Afton Mikelson A, PA-C 02/03/24 1721

## 2024-02-03 NOTE — ED Triage Notes (Signed)
 Pt reports RLQ abdominal pain that started today. Denies fevers/n/v. Pt does report he had a vasectomy 2 weeks ago.

## 2024-04-11 ENCOUNTER — Encounter (HOSPITAL_COMMUNITY): Payer: Self-pay | Admitting: Interventional Radiology
